# Patient Record
Sex: Female | Born: 1985 | Race: White | Hispanic: No | Smoking: Never smoker
Health system: Southern US, Community
[De-identification: ages and names within clinical notes are randomized; demographics above are authoritative.]

## PROBLEM LIST (undated history)

## (undated) ENCOUNTER — Inpatient Hospital Stay (HOSPITAL_COMMUNITY): Payer: Self-pay

## (undated) DIAGNOSIS — N83209 Unspecified ovarian cyst, unspecified side: Secondary | ICD-10-CM

## (undated) DIAGNOSIS — R102 Pelvic and perineal pain: Secondary | ICD-10-CM

## (undated) DIAGNOSIS — G8929 Other chronic pain: Secondary | ICD-10-CM

## (undated) DIAGNOSIS — I4510 Unspecified right bundle-branch block: Secondary | ICD-10-CM

## (undated) DIAGNOSIS — Z8619 Personal history of other infectious and parasitic diseases: Secondary | ICD-10-CM

## (undated) DIAGNOSIS — R1031 Right lower quadrant pain: Secondary | ICD-10-CM

## (undated) DIAGNOSIS — N946 Dysmenorrhea, unspecified: Secondary | ICD-10-CM

## (undated) HISTORY — PX: UPPER GASTROINTESTINAL ENDOSCOPY: SHX188

## (undated) HISTORY — PX: LAPAROSCOPIC ENDOMETRIOSIS FULGURATION: SUR769

## (undated) HISTORY — PX: WISDOM TOOTH EXTRACTION: SHX21

## (undated) HISTORY — DX: Unspecified ovarian cyst, unspecified side: N83.209

## (undated) HISTORY — DX: Personal history of other infectious and parasitic diseases: Z86.19

---

## 2007-08-13 ENCOUNTER — Emergency Department (HOSPITAL_COMMUNITY): Admission: EM | Admit: 2007-08-13 | Discharge: 2007-08-13 | Payer: Self-pay | Admitting: Emergency Medicine

## 2008-11-30 HISTORY — PX: TONSILLECTOMY: SUR1361

## 2011-10-14 ENCOUNTER — Emergency Department (HOSPITAL_COMMUNITY)
Admission: EM | Admit: 2011-10-14 | Discharge: 2011-10-15 | Disposition: A | Discharge status: Home / Self Care | Payer: BC Managed Care – PPO | Attending: Emergency Medicine | Admitting: Emergency Medicine

## 2011-10-14 ENCOUNTER — Encounter: Payer: Self-pay | Admitting: Emergency Medicine

## 2011-10-14 DIAGNOSIS — N949 Unspecified condition associated with female genital organs and menstrual cycle: Secondary | ICD-10-CM | POA: Insufficient documentation

## 2011-10-14 DIAGNOSIS — N644 Mastodynia: Secondary | ICD-10-CM | POA: Insufficient documentation

## 2011-10-14 DIAGNOSIS — R109 Unspecified abdominal pain: Secondary | ICD-10-CM | POA: Insufficient documentation

## 2011-10-14 DIAGNOSIS — M79609 Pain in unspecified limb: Secondary | ICD-10-CM | POA: Insufficient documentation

## 2011-10-14 DIAGNOSIS — R1110 Vomiting, unspecified: Secondary | ICD-10-CM | POA: Insufficient documentation

## 2011-10-14 DIAGNOSIS — N83209 Unspecified ovarian cyst, unspecified side: Secondary | ICD-10-CM

## 2011-10-14 LAB — DIFFERENTIAL
Eosinophils Relative: 0 % (ref 0–5)
Lymphocytes Relative: 12 % (ref 12–46)
Lymphs Abs: 1.7 10*3/uL (ref 0.7–4.0)
Monocytes Absolute: 1.2 10*3/uL — ABNORMAL HIGH (ref 0.1–1.0)
Monocytes Relative: 9 % (ref 3–12)

## 2011-10-14 LAB — BASIC METABOLIC PANEL
CO2: 22 mEq/L (ref 19–32)
Calcium: 9.5 mg/dL (ref 8.4–10.5)
Creatinine, Ser: 0.67 mg/dL (ref 0.50–1.10)
Glucose, Bld: 95 mg/dL (ref 70–99)
Sodium: 135 mEq/L (ref 135–145)

## 2011-10-14 LAB — CBC
HCT: 38.2 % (ref 36.0–46.0)
Hemoglobin: 13.4 g/dL (ref 12.0–15.0)
MCV: 91 fL (ref 78.0–100.0)
RBC: 4.2 MIL/uL (ref 3.87–5.11)
WBC: 13.7 10*3/uL — ABNORMAL HIGH (ref 4.0–10.5)

## 2011-10-14 LAB — URINALYSIS, ROUTINE W REFLEX MICROSCOPIC
Glucose, UA: NEGATIVE mg/dL
Hgb urine dipstick: NEGATIVE
Protein, ur: NEGATIVE mg/dL

## 2011-10-14 LAB — POCT PREGNANCY, URINE: Preg Test, Ur: NEGATIVE

## 2011-10-14 MED ORDER — FENTANYL CITRATE 0.05 MG/ML IJ SOLN
50.0000 ug | INTRAMUSCULAR | Status: DC | PRN
  Administered 2011-10-14 – 2011-10-15 (×2): 50 ug via INTRAVENOUS
  Filled 2011-10-14 (×2): qty 2

## 2011-10-14 MED ORDER — ONDANSETRON HCL 4 MG/2ML IJ SOLN
4.0000 mg | Freq: Once | INTRAMUSCULAR | Status: AC
  Administered 2011-10-14: 4 mg via INTRAVENOUS
  Filled 2011-10-14: qty 2

## 2011-10-14 MED ORDER — SODIUM CHLORIDE 0.9 % IV SOLN
Freq: Once | INTRAVENOUS | Status: AC
  Administered 2011-10-14: 23:00:00 via INTRAVENOUS

## 2011-10-14 NOTE — ED Provider Notes (Signed)
History     CSN: [id number] Arrival date & time: 10/14/2011  8:51 PM   First MD Initiated Contact with Patient 10/14/11 2248      Chief Complaint  Patient presents with  . Abdominal Pain    (Consider location/radiation/quality/duration/timing/severity/associated sxs/prior treatment) HPI This is a 25 year old white female with a history of endometriosis. She's had a four-day history of left lower and left upper abdominal pain. The pain is described as deep and worse with palpation or movement. It radiates to the left flank and the back of the left leg. She also complains of pain in the left breast for about a month but states this is a different pain in May not be related. She has had some vomiting which he describes as retching. She denies diarrhea, dysuria, fever, vaginal bleeding or discharge. She has had chills. Her pain is described as moderate to severe. It is different than prior her endometriosis pain. It has not been controlled with her home medications, and has been worsening.  Past Medical History  Diagnosis Date  . Endometriosis     Past Surgical History  Procedure Date  . Laparoscopic endometriosis fulguration   . Tonsillectomy   . Upper gastrointestinal endoscopy     No family history on file.  History  Substance Use Topics  . Smoking status: Never Smoker   . Smokeless tobacco: Not on file  . Alcohol Use: Yes     OCCASIONAL    OB History    Grav Para Term Preterm Abortions TAB SAB Ect Mult Living                  Review of Systems  All other systems reviewed and are negative.    Allergies  Review of patient's allergies indicates no known allergies.  Home Medications   Current Outpatient Rx  Name Route Sig Dispense Refill  . HYDROCODONE-ACETAMINOPHEN 5-500 MG PO TABS Oral Take 1-2 tablets by mouth every 6 (six) hours as needed. For pain       BP 109/66  Pulse 96  Temp(Src) 99.1 F (37.3 C) (Oral)  Resp 19  SpO2 97%  LMP  09/13/2011  Physical Exam General: Well-developed, well-nourished female in no acute distress; appearance consistent with age of record HENT: normocephalic, atraumatic Eyes: pupils equal round and reactive to light; extraocular muscles intact Neck: supple Heart: regular rate and rhythm; no murmurs, rubs or gallops Lungs: clear to auscultation bilaterally Chest: Left submammary tenderness Abdomen: soft; moderate to severe left lower left upper quadrant tenderness; nondistended; no masses or hepatosplenomegaly; bowel sounds present GU: Left flank tenderness; normal external genitalia; bilateral adnexal tenderness L>R which does not reproduce the pain of chief complaint Extremities: No deformity; full range of motion; pulses normal Neurologic: Awake, alert and oriented; motor function intact in all extremities and symmetric; no facial droop Skin: Warm and dry Psychiatric: Normal mood and affect    ED Course  Procedures (including critical care time)    MDM   Nursing notes and vitals signs, including pulse oximetry, reviewed.  Summary of this visit's results, reviewed by myself:  Labs:  Results for orders placed during the hospital encounter of 10/14/11  CBC      Component Value Range   WBC 13.7 (*) 4.0 - 10.5 (K/uL)   RBC 4.20  3.87 - 5.11 (MIL/uL)   Hemoglobin 13.4  12.0 - 15.0 (g/dL)   HCT 82.9  56.2 - 13.0 (%)   MCV 91.0  78.0 - 100.0 (fL)  MCH 31.9  26.0 - 34.0 (pg)   MCHC 35.1  30.0 - 36.0 (g/dL)   RDW 16.1  09.6 - 04.5 (%)   Platelets 263  150 - 400 (K/uL)  DIFFERENTIAL      Component Value Range   Neutrophils Relative 79 (*) 43 - 77 (%)   Neutro Abs 10.8 (*) 1.7 - 7.7 (K/uL)   Lymphocytes Relative 12  12 - 46 (%)   Lymphs Abs 1.7  0.7 - 4.0 (K/uL)   Monocytes Relative 9  3 - 12 (%)   Monocytes Absolute 1.2 (*) 0.1 - 1.0 (K/uL)   Eosinophils Relative 0  0 - 5 (%)   Eosinophils Absolute 0.1  0.0 - 0.7 (K/uL)   Basophils Relative 0  0 - 1 (%)   Basophils  Absolute 0.0  0.0 - 0.1 (K/uL)  BASIC METABOLIC PANEL      Component Value Range   Sodium 135  135 - 145 (mEq/L)   Potassium 3.5  3.5 - 5.1 (mEq/L)   Chloride 100  96 - 112 (mEq/L)   CO2 22  19 - 32 (mEq/L)   Glucose, Bld 95  70 - 99 (mg/dL)   BUN 12  6 - 23 (mg/dL)   Creatinine, Ser 4.09  0.50 - 1.10 (mg/dL)   Calcium 9.5  8.4 - 81.1 (mg/dL)   GFR calc non Af Amer >90  >90 (mL/min)   GFR calc Af Amer >90  >90 (mL/min)  URINALYSIS, ROUTINE W REFLEX MICROSCOPIC      Component Value Range   Color, Urine YELLOW  YELLOW    Appearance CLEAR  CLEAR    Specific Gravity, Urine 1.015  1.005 - 1.030    pH 5.5  5.0 - 8.0    Glucose, UA NEGATIVE  NEGATIVE (mg/dL)   Hgb urine dipstick NEGATIVE  NEGATIVE    Bilirubin Urine NEGATIVE  NEGATIVE    Ketones, ur NEGATIVE  NEGATIVE (mg/dL)   Protein, ur NEGATIVE  NEGATIVE (mg/dL)   Urobilinogen, UA 0.2  0.0 - 1.0 (mg/dL)   Nitrite NEGATIVE  NEGATIVE    Leukocytes, UA NEGATIVE  NEGATIVE   POCT PREGNANCY, URINE      Component Value Range   Preg Test, Ur NEGATIVE    LIPASE, BLOOD      Component Value Range   Lipase 19  11 - 59 (U/L)   Ct Abdomen Pelvis W Contrast  10/15/2011  *RADIOLOGY REPORT*  Clinical Data: Left-sided abdominal pain for 5 days; vomiting. Slight chills.  Dysuria and abdominal cramping.  CT ABDOMEN AND PELVIS WITH CONTRAST  Technique:  Multidetector CT imaging of the abdomen and pelvis was performed following the standard protocol during bolus administration of intravenous contrast.  Contrast: OMNIPAQUE IOHEXOL 300 MG/ML IV SOLN  Comparison: None.  Findings: The visualized lung bases are clear.  A small 8 mm hypodensity is noted within the spleen; this is nonspecific but may reflect a small cyst.  The liver is unremarkable in appearance.  The gallbladder is within normal limits.  The pancreas and adrenal glands are unremarkable.  The kidneys are unremarkable in appearance.  There is no evidence of hydronephrosis.  No renal or  ureteral stones are seen.  No perinephric stranding is appreciated.  No free fluid is identified.  The small bowel is unremarkable in appearance.  The stomach is within normal limits.  No acute vascular abnormalities are seen.  The appendix is normal in caliber and contains trace contrast, without evidence for appendicitis.  Contrast progresses to the level of the distal transverse colon.  The colon is unremarkable in appearance.  The bladder is mildly distended and grossly unremarkable in appearance.  The uterus is within normal limits. A 3.1 cm left adnexal cystic lesion is noted; though this is likely physiologic in nature, pelvic ultrasound could be considered for further evaluation if the patient has left adnexal symptoms.  Trace free fluid within the pelvis is likely physiologic.  No inguinal lymphadenopathy is seen.  No acute osseous abnormalities are identified.  IMPRESSION:  1.  3.1 cm left adnexal cystic lesion; though this is likely physiologic in nature, pelvic ultrasound could be considered for further evaluation if the patient has left adnexal symptoms.  Trace free fluid within the pelvis is likely physiologic. 2.  8 mm hypodensity within the spleen is nonspecific but may reflect a small cyst.  Original Report Authenticated By: Tonia Ghent, M.D.   4:00 AM Patient requests referral to gynecologist. Suspect pain is a combination of left ovarian cyst and endometriosis.       Carlisle Beers Narely Nobles, MD 10/15/11 0400

## 2011-10-14 NOTE — ED Notes (Signed)
PT. REPORTS LEFT ABDOMINAL PAIN X5 DAYS , VOMITTING , DENIES DIARRHEA ,  SLIGHT CHILLS,  DENIES VAGINAL DISCHARGE .  DYSURIA WITH LOW ABDOMINAL CRAMPING.

## 2011-10-15 ENCOUNTER — Emergency Department (HOSPITAL_COMMUNITY): Payer: BC Managed Care – PPO

## 2011-10-15 ENCOUNTER — Encounter (HOSPITAL_COMMUNITY): Payer: Self-pay | Admitting: Radiology

## 2011-10-15 MED ORDER — FENTANYL CITRATE 0.05 MG/ML IJ SOLN
100.0000 ug | Freq: Once | INTRAMUSCULAR | Status: AC
  Administered 2011-10-15: 100 ug via INTRAVENOUS
  Filled 2011-10-15: qty 2

## 2011-10-15 MED ORDER — IOHEXOL 300 MG/ML  SOLN
100.0000 mL | Freq: Once | INTRAMUSCULAR | Status: AC | PRN
  Administered 2011-10-15: 100 mL via INTRAVENOUS

## 2011-10-15 MED ORDER — OXYCODONE-ACETAMINOPHEN 5-325 MG PO TABS: 1.0000 | ORAL_TABLET | ORAL | Status: AC | PRN

## 2011-10-15 MED ORDER — ONDANSETRON HCL 8 MG PO TABS: 8.0000 mg | ORAL_TABLET | Freq: Three times a day (TID) | ORAL | Status: AC | PRN

## 2011-10-15 NOTE — ED Notes (Signed)
Pt given oral contrast.

## 2011-10-15 NOTE — ED Notes (Signed)
Patient states her pain is coming and going.

## 2011-10-15 NOTE — ED Notes (Signed)
Pt ambulatory to restroom steady gait noted. No distress noted. Pt awaiting CT

## 2011-12-01 HISTORY — PX: COLONOSCOPY: SHX174

## 2011-12-01 NOTE — L&D Delivery Note (Signed)
Delivery Note At 2:12 PM a viable and healthy female was delivered via Vaginal, Spontaneous Delivery (Presentation: ; Occiput Anterior).  APGAR: 8, 9; weight .   Placenta status: Intact, Spontaneous.  Cord: 3 vessels with the following complications: None.  Cord pH: na. Loose  x one.  Anesthesia: Epidural  Episiotomy: None Lacerations: 2nd degree;Perineal Suture Repair: 2.0 3.0 vicryl rapide Est. Blood Loss (mL): 300  Mom to postpartum.  Baby to nursery-stable.  Sherry Carr J 08/04/2012, 2:27 PM

## 2011-12-06 ENCOUNTER — Encounter (HOSPITAL_COMMUNITY): Payer: Self-pay | Admitting: Pharmacist

## 2011-12-09 ENCOUNTER — Other Ambulatory Visit: Payer: Self-pay | Admitting: Obstetrics and Gynecology

## 2011-12-16 ENCOUNTER — Other Ambulatory Visit (HOSPITAL_COMMUNITY): Payer: BC Managed Care – PPO

## 2011-12-17 ENCOUNTER — Other Ambulatory Visit (HOSPITAL_COMMUNITY): Payer: BC Managed Care – PPO

## 2011-12-18 ENCOUNTER — Ambulatory Visit (HOSPITAL_COMMUNITY)
Admission: RE | Admit: 2011-12-18 | Payer: BC Managed Care – PPO | Source: Ambulatory Visit | Admitting: Obstetrics and Gynecology

## 2011-12-18 ENCOUNTER — Encounter (HOSPITAL_COMMUNITY): Admission: RE | Payer: Self-pay | Source: Ambulatory Visit

## 2011-12-18 SURGERY — ROBOTIC ASSISTED LAPAROSCOPIC LYSIS OF ADHESION
Anesthesia: General

## 2012-01-20 LAB — OB RESULTS CONSOLE GC/CHLAMYDIA: Chlamydia: NEGATIVE

## 2012-02-10 LAB — OB RESULTS CONSOLE ANTIBODY SCREEN: Antibody Screen: NEGATIVE

## 2012-02-10 LAB — OB RESULTS CONSOLE RUBELLA ANTIBODY, IGM: Rubella: IMMUNE

## 2012-05-08 ENCOUNTER — Encounter (HOSPITAL_COMMUNITY): Payer: Self-pay | Admitting: Emergency Medicine

## 2012-05-08 ENCOUNTER — Inpatient Hospital Stay (HOSPITAL_COMMUNITY)
Admission: EM | Admit: 2012-05-08 | Discharge: 2012-05-08 | Disposition: A | Discharge status: Home / Self Care | Payer: BC Managed Care – PPO | Attending: Obstetrics and Gynecology | Admitting: Obstetrics and Gynecology

## 2012-05-08 DIAGNOSIS — W19XXXA Unspecified fall, initial encounter: Secondary | ICD-10-CM

## 2012-05-08 DIAGNOSIS — O26899 Other specified pregnancy related conditions, unspecified trimester: Secondary | ICD-10-CM

## 2012-05-08 DIAGNOSIS — Y92009 Unspecified place in unspecified non-institutional (private) residence as the place of occurrence of the external cause: Secondary | ICD-10-CM | POA: Insufficient documentation

## 2012-05-08 DIAGNOSIS — O99891 Other specified diseases and conditions complicating pregnancy: Secondary | ICD-10-CM | POA: Insufficient documentation

## 2012-05-08 DIAGNOSIS — R1032 Left lower quadrant pain: Secondary | ICD-10-CM | POA: Insufficient documentation

## 2012-05-08 DIAGNOSIS — W1809XA Striking against other object with subsequent fall, initial encounter: Secondary | ICD-10-CM | POA: Insufficient documentation

## 2012-05-08 LAB — RAPID URINE DRUG SCREEN, HOSP PERFORMED
Amphetamines: NOT DETECTED
Barbiturates: NOT DETECTED
Benzodiazepines: NOT DETECTED
Tetrahydrocannabinol: NOT DETECTED

## 2012-05-08 LAB — OB RESULTS CONSOLE GC/CHLAMYDIA: Chlamydia: NEGATIVE

## 2012-05-08 LAB — OB RESULTS CONSOLE RPR: RPR: NONREACTIVE

## 2012-05-08 LAB — OB RESULTS CONSOLE ABO/RH: RH Type: POSITIVE

## 2012-05-08 MED ORDER — KETOROLAC TROMETHAMINE 30 MG/ML IJ SOLN
30.0000 mg | Freq: Once | INTRAMUSCULAR | Status: AC
  Administered 2012-05-08: 30 mg via INTRAMUSCULAR
  Filled 2012-05-08: qty 1

## 2012-05-08 NOTE — Progress Notes (Signed)
Report called to Junious Dresser, RN in MAU to accept pt for further monitoring. Thayer Ohm, ED RN notified.  Care link to be called for transfer

## 2012-05-08 NOTE — MAU Note (Signed)
1.5 hours of monitoring completed at [facility] ED. Fetal monitoring strip placed with chart.
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
## 2012-05-08 NOTE — MAU Note (Signed)
Pt has a ride home with a friend. Patient says she feels safe riding with her friend and going home.
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
## 2012-05-08 NOTE — Progress Notes (Signed)
Care link here to transfer pt to MAU at [facility]
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
## 2012-07-21 ENCOUNTER — Telehealth (HOSPITAL_COMMUNITY): Payer: Self-pay | Admitting: *Deleted

## 2012-07-21 ENCOUNTER — Encounter (HOSPITAL_COMMUNITY): Payer: Self-pay | Admitting: *Deleted

## 2012-07-21 NOTE — Telephone Encounter (Signed)
Preadmission screen  

## 2012-08-01 ENCOUNTER — Inpatient Hospital Stay (HOSPITAL_COMMUNITY): Payer: BC Managed Care – PPO

## 2012-08-01 ENCOUNTER — Inpatient Hospital Stay (HOSPITAL_COMMUNITY)
Admission: AD | Admit: 2012-08-01 | Discharge: 2012-08-01 | Disposition: A | Discharge status: Home / Self Care | Payer: BC Managed Care – PPO | Source: Ambulatory Visit | Attending: Obstetrics & Gynecology | Admitting: Obstetrics & Gynecology

## 2012-08-01 ENCOUNTER — Encounter (HOSPITAL_COMMUNITY): Payer: Self-pay | Admitting: *Deleted

## 2012-08-01 DIAGNOSIS — O368190 Decreased fetal movements, unspecified trimester, not applicable or unspecified: Secondary | ICD-10-CM | POA: Insufficient documentation

## 2012-08-01 LAB — URINE MICROSCOPIC-ADD ON

## 2012-08-01 LAB — URINALYSIS, ROUTINE W REFLEX MICROSCOPIC
Bilirubin Urine: NEGATIVE
Hgb urine dipstick: NEGATIVE
Protein, ur: NEGATIVE mg/dL
Urobilinogen, UA: 0.2 mg/dL (ref 0.0–1.0)

## 2012-08-01 LAB — AMNISURE RUPTURE OF MEMBRANE (ROM) NOT AT ARMC: Amnisure ROM: NEGATIVE

## 2012-08-01 NOTE — MAU Provider Note (Signed)
  History     CSN: [id number]  Arrival date and time: 08/01/12 1443   First Provider Initiated Contact with Patient 08/01/12 1630      Chief Complaint  Patient presents with  . Decreased Fetal Movement  . Vaginal Discharge   HPI 26 yo, G1 at 38.4/7 wks. C/o Decreased FMs.  Patent c/o not feeling any fetal movement since last night. She tried drinking water, resting on left side, eating, drinking fluids without any active FMs. Denies contrxns. C/o wet vaginal discharge but no active leaking. Denies bleeding.  Denies pregnancy complications, good PNCare (Dr Stana Bunting)  Past Medical History  Diagnosis Date  . Endometriosis   . Pregnant   . Ovarian cyst   . H/O varicella   . Contact with or exposure to other viral diseases     Past Surgical History  Procedure Date  . Laparoscopic endometriosis fulguration   . Tonsillectomy   . Upper gastrointestinal endoscopy   . Colonoscopy   . Wisdom tooth extraction     Family History  Problem Relation Age of Onset  . Adopted: Yes  . Anesthesia problems Neg Hx     History  Substance Use Topics  . Smoking status: Never Smoker   . Smokeless tobacco: Never Used  . Alcohol Use: Yes     OCCASIONAL    Allergies: No Known Allergies  Prescriptions prior to admission  Medication Sig Dispense Refill  . fluticasone (FLONASE) 50 MCG/ACT nasal spray Place 2 sprays into the nose daily as needed. For allergies      . pantoprazole (PROTONIX) 40 MG tablet Take 40 mg by mouth daily.       . Prenatal Vit-Fe Fumarate-FA (PRENATAL MULTIVITAMIN) TABS Take 1 tablet by mouth daily.        ROS Physical Exam   Blood pressure 121/70, pulse 81, temperature 98.1 F (36.7 C), temperature source Oral, resp. rate 16, height 5' 3.5" (1.613 m), weight 167 lb 3.2 oz (75.841 kg), last menstrual period 10/27/2011, SpO2 99.00%.  Physical Exam A&O x 3, no acute distress. Pleasant Abdo soft, non tender, non acute Extr no edema/ tenderness Pelvic  -no fluid. Amnisure - negative  FHT - 120/ + accels/ no decels/ moderate variability- category I Toco noted irreg contrxns (pt feels has mild cramps)  Patient still does not feels active FMs here, will get sono for BPP and AFI.   MAU Course  Procedures Limited Sono - AFI 14 cm, BPP 8/8.  Per sono report- fetal decel to 118 from 135 noted, could be return to baseline after acceleration to 135 since there are NO decelerations noted on FHT before and after sonography and FHT baseline is in 117-120 range.   Assessment and Plan  38.4 wks, Decreased FMs with NST in category I and BPP 8/8 annd nl AFI.  Kick counts reviewed, will see her back in office as scheduled tomorrow (Dr Billy Coast) and will add NST to office visit. FAC reviewed, labor precautions. Reassurance.   Mikah Rottinghaus R 08/01/2012, 5:12 PM

## 2012-08-01 NOTE — Progress Notes (Signed)
Dr. Juliene Pina informed of U/S results, & that decels were seen on U/S.  No decels present on EFM since pt in MAU.  Tracing reviewed by Dr. Juliene Pina.  DC order received.

## 2012-08-01 NOTE — MAU Note (Signed)
Patient states she has not felt any fetal movement since last night. Fetal heart rate in triage in the 150's. States she had a gush of clear fluid last night at 2100 and again this am. Continues to feel wet. No contractions.

## 2012-08-03 ENCOUNTER — Inpatient Hospital Stay (HOSPITAL_COMMUNITY)
Admission: AD | Admit: 2012-08-03 | Discharge: 2012-08-06 | DRG: 373 | Disposition: A | Discharge status: Home / Self Care | Payer: BC Managed Care – PPO | Source: Ambulatory Visit | Attending: Obstetrics and Gynecology | Admitting: Obstetrics and Gynecology

## 2012-08-03 ENCOUNTER — Encounter (HOSPITAL_COMMUNITY): Payer: Self-pay | Admitting: *Deleted

## 2012-08-03 DIAGNOSIS — O368190 Decreased fetal movements, unspecified trimester, not applicable or unspecified: Secondary | ICD-10-CM | POA: Diagnosis present

## 2012-08-03 LAB — OB RESULTS CONSOLE HIV ANTIBODY (ROUTINE TESTING): HIV: NONREACTIVE

## 2012-08-03 LAB — CBC
HCT: 32 % — ABNORMAL LOW (ref 36.0–46.0)
Hemoglobin: 10.6 g/dL — ABNORMAL LOW (ref 12.0–15.0)
MCHC: 33.1 g/dL (ref 30.0–36.0)
MCV: 91.7 fL (ref 78.0–100.0)

## 2012-08-03 LAB — OB RESULTS CONSOLE RPR: RPR: NONREACTIVE

## 2012-08-03 MED ORDER — OXYTOCIN BOLUS FROM INFUSION
250.0000 mL | Freq: Once | INTRAVENOUS | Status: DC
  Filled 2012-08-03: qty 500

## 2012-08-03 MED ORDER — SODIUM CHLORIDE 0.9 % IJ SOLN: 3.0000 mL | Freq: Two times a day (BID) | INTRAMUSCULAR | Status: DC

## 2012-08-03 MED ORDER — CITRIC ACID-SODIUM CITRATE 334-500 MG/5ML PO SOLN: 30.0000 mL | ORAL | Status: DC | PRN

## 2012-08-03 MED ORDER — LACTATED RINGERS IV SOLN
INTRAVENOUS | Status: DC
  Administered 2012-08-03 – 2012-08-04 (×4): via INTRAVENOUS

## 2012-08-03 MED ORDER — ACETAMINOPHEN 325 MG PO TABS: 650.0000 mg | ORAL_TABLET | ORAL | Status: DC | PRN

## 2012-08-03 MED ORDER — MISOPROSTOL 25 MCG QUARTER TABLET
25.0000 ug | ORAL_TABLET | ORAL | Status: DC
  Administered 2012-08-03 (×2): 25 ug via VAGINAL
  Filled 2012-08-03 (×3): qty 1
  Filled 2012-08-03: qty 0.25
  Filled 2012-08-03 (×2): qty 1
  Filled 2012-08-03: qty 0.25
  Filled 2012-08-03: qty 1

## 2012-08-03 MED ORDER — LIDOCAINE HCL (PF) 1 % IJ SOLN
30.0000 mL | INTRAMUSCULAR | Status: DC | PRN
  Filled 2012-08-03: qty 30

## 2012-08-03 MED ORDER — PENICILLIN G POTASSIUM 5000000 UNITS IJ SOLR: 5.0000 10*6.[IU] | Freq: Once | INTRAVENOUS | Status: DC

## 2012-08-03 MED ORDER — ONDANSETRON HCL 4 MG/2ML IJ SOLN
4.0000 mg | Freq: Four times a day (QID) | INTRAMUSCULAR | Status: DC | PRN
  Administered 2012-08-04: 4 mg via INTRAVENOUS
  Filled 2012-08-03 (×2): qty 2

## 2012-08-03 MED ORDER — SODIUM CHLORIDE 0.9 % IV SOLN: 250.0000 mL | INTRAVENOUS | Status: DC | PRN

## 2012-08-03 MED ORDER — OXYCODONE-ACETAMINOPHEN 5-325 MG PO TABS: 1.0000 | ORAL_TABLET | ORAL | Status: DC | PRN

## 2012-08-03 MED ORDER — IBUPROFEN 600 MG PO TABS: 600.0000 mg | ORAL_TABLET | Freq: Four times a day (QID) | ORAL | Status: DC | PRN

## 2012-08-03 MED ORDER — OXYTOCIN 40 UNITS IN LACTATED RINGERS INFUSION - SIMPLE MED: 62.5000 mL/h | Freq: Once | INTRAVENOUS | Status: DC

## 2012-08-03 MED ORDER — FLEET ENEMA 7-19 GM/118ML RE ENEM: 1.0000 | ENEMA | RECTAL | Status: DC | PRN

## 2012-08-03 MED ORDER — PENICILLIN G POTASSIUM 5000000 UNITS IJ SOLR: 2.5000 10*6.[IU] | INTRAVENOUS | Status: DC

## 2012-08-03 MED ORDER — SODIUM CHLORIDE 0.9 % IJ SOLN: 3.0000 mL | INTRAMUSCULAR | Status: DC | PRN

## 2012-08-03 MED ORDER — LACTATED RINGERS IV SOLN: 500.0000 mL | INTRAVENOUS | Status: DC | PRN

## 2012-08-03 MED ORDER — ZOLPIDEM TARTRATE 5 MG PO TABS
5.0000 mg | ORAL_TABLET | Freq: Every evening | ORAL | Status: DC | PRN
  Administered 2012-08-03: 5 mg via ORAL
  Filled 2012-08-03: qty 1

## 2012-08-03 MED ORDER — TERBUTALINE SULFATE 1 MG/ML IJ SOLN: 0.2500 mg | Freq: Once | INTRAMUSCULAR | Status: AC | PRN

## 2012-08-03 NOTE — Progress Notes (Signed)
Sherry Carr is a 26 y.o. G2P0010 at [redacted]w[redacted]d by LMP admitted for induction of labor due to history of decreased FM, Variable decelerations on repeat NSTs. History of chronic LOF with nl AFI on sono and neg Fern and nitrazine.  Subjective: No complaints.  Objective: BP 114/66  Pulse 88  Temp 98.1 F (36.7 C) (Oral)  Resp 16  Ht 5' 3.5" (1.613 m)  Wt 76.204 kg (168 lb)  BMI 29.29 kg/m2  LMP 10/27/2011      FHT:  FHR: 155 bpm, variability: moderate,  accelerations:  Present,  decelerations:  Absent UC:   rare SVE:   Dilation: Closed Effacement (%): Thick Station: -3 Exam by:: Bonnielee Haff RN Cytotec placed  Labs: Lab Results  Component Value Date   WBC 10.0 08/03/2012   HGB 10.6* 08/03/2012   HCT 32.0* 08/03/2012   MCV 91.7 08/03/2012   PLT 284 08/03/2012    Assessment / Plan: 39 weeks Induction for history of dec FM and decels on NST  Labor: Progressing normally Preeclampsia:  na Fetal Wellbeing:  Category I Pain Control:  Labor support without medications I/D:  n/a Anticipated MOD:  NSVD  Sherry Carr 08/03/2012, 7:44 PM

## 2012-08-03 NOTE — H&P (Signed)
NAMETERRAH, DECOSTER NO.:  [id number]  MEDICAL RECORD NO.:  [id number]  LOCATION:  9175                          FACILITY:  [facility]  PHYSICIAN:  Lenoard Aden, M.D.DATE OF BIRTH:  December 10, 1985  DATE OF ADMISSION:  08/03/2012 DATE OF DISCHARGE:                             HISTORY & PHYSICAL   CHIEF COMPLAINT:  History of decelerations, decreased fetal movement, and  questionable leakage of fluid for induction at 39 weeks.  HISTORY OF PRESENT ILLNESS:  She is a 26 year old white female, G2, P0 at [redacted] weeks gestation who presents for induction.  The patient has a history of repetitive visits to the hospital for decreased fetal movement.  An NST is performed at the hospital and in the office the patient has had a history of intermittent deep variable decelerations, but otherwise reactive NSTs.  She has had multiple ultrasounds revealing normal fluid and adequate growth.  She continues to report decreased fetal movement.  Due to the concerns and issues of intermittent variable decelerations, decision was made to proceed with cervical ripening and induction at 39 weeks' gestation.  ALLERGIES:  No known drug allergies.  MEDICATIONS:  Prenatal vitamins and Flonase as needed.  MEDICAL HISTORY:  She has a medical history of endometriosis and gastroesophageal reflux.  SOCIAL HISTORY:  She has a social history, which is noncontributory.  FAMILY HISTORY:  Noncontributory.  Previous history of an abortion at 6 weeks in 2009, prenatal course as noted.  SURGICAL HISTORY:  For tonsillectomy and laparoscopy for endometriosis.  PHYSICAL EXAMINATION:  GENERAL:  She is a well-developed well-nourished white female, in no acute distress. HEENT:  Normal. NECK:  Supple.  Full range of motion. LUNGS:  Clear. HEART:  Regular rhythm. ABDOMEN:  Soft, gravid, nontender.  Estimated fetal weight 7 to 7.5 pounds. GU:  Cervix is closed, 50%, vertex, -2. EXTREMITIES:  There are no  cords. NEUROLOGIC:  Nonfocal. SKIN:  Intact.  Cytotec is placed.  NST is reactive.  Rare contractions were noted.  IMPRESSION: 1. A 39-week intrauterine pregnancy. 2. History of chronic decreased fetal movement, and history of     variable decelerations on NST with otherwise reassuring     surveillance.  PLAN:  Proceed with induction.  Cytotec placed, Pitocin in the a.m. Anticipate cautious attempts at vaginal delivery.     Lenoard Aden, M.D.     RJT/MEDQ  D:  08/03/2012  T:  08/03/2012  Job:  [id number]

## 2012-08-04 ENCOUNTER — Encounter (HOSPITAL_COMMUNITY): Payer: Self-pay | Admitting: Anesthesiology

## 2012-08-04 ENCOUNTER — Encounter (HOSPITAL_COMMUNITY): Payer: Self-pay

## 2012-08-04 ENCOUNTER — Inpatient Hospital Stay (HOSPITAL_COMMUNITY): Payer: BC Managed Care – PPO | Admitting: Anesthesiology

## 2012-08-04 MED ORDER — SIMETHICONE 80 MG PO CHEW
80.0000 mg | CHEWABLE_TABLET | ORAL | Status: DC | PRN
  Administered 2012-08-05 (×2): 80 mg via ORAL

## 2012-08-04 MED ORDER — FENTANYL 2.5 MCG/ML BUPIVACAINE 1/10 % EPIDURAL INFUSION (WH - ANES)
14.0000 mL/h | INTRAMUSCULAR | Status: DC
  Administered 2012-08-04 (×2): 14 mL/h via EPIDURAL
  Filled 2012-08-04 (×2): qty 60

## 2012-08-04 MED ORDER — PENICILLIN G POTASSIUM 5000000 UNITS IJ SOLR
5.0000 10*6.[IU] | Freq: Once | INTRAVENOUS | Status: AC
  Administered 2012-08-04: 5 10*6.[IU] via INTRAVENOUS
  Filled 2012-08-04: qty 5

## 2012-08-04 MED ORDER — METHYLERGONOVINE MALEATE 0.2 MG PO TABS: 0.2000 mg | ORAL_TABLET | ORAL | Status: DC | PRN

## 2012-08-04 MED ORDER — TERBUTALINE SULFATE 1 MG/ML IJ SOLN: 0.2500 mg | Freq: Once | INTRAMUSCULAR | Status: DC | PRN

## 2012-08-04 MED ORDER — WITCH HAZEL-GLYCERIN EX PADS: 1.0000 "application " | MEDICATED_PAD | CUTANEOUS | Status: DC | PRN

## 2012-08-04 MED ORDER — EPHEDRINE 5 MG/ML INJ: 10.0000 mg | INTRAVENOUS | Status: DC | PRN

## 2012-08-04 MED ORDER — ONDANSETRON HCL 4 MG PO TABS
4.0000 mg | ORAL_TABLET | ORAL | Status: DC | PRN
  Administered 2012-08-05: 4 mg via ORAL
  Filled 2012-08-04: qty 1

## 2012-08-04 MED ORDER — PRENATAL MULTIVITAMIN CH
1.0000 | ORAL_TABLET | Freq: Every day | ORAL | Status: DC
  Administered 2012-08-04 – 2012-08-06 (×3): 1 via ORAL
  Filled 2012-08-04 (×3): qty 1

## 2012-08-04 MED ORDER — PENICILLIN G POTASSIUM 5000000 UNITS IJ SOLR
2.5000 10*6.[IU] | INTRAVENOUS | Status: DC
  Administered 2012-08-04 (×2): 2.5 10*6.[IU] via INTRAVENOUS
  Filled 2012-08-04 (×5): qty 2.5

## 2012-08-04 MED ORDER — PHENYLEPHRINE 40 MCG/ML (10ML) SYRINGE FOR IV PUSH (FOR BLOOD PRESSURE SUPPORT)
80.0000 ug | PREFILLED_SYRINGE | INTRAVENOUS | Status: DC | PRN
  Filled 2012-08-04: qty 5

## 2012-08-04 MED ORDER — IBUPROFEN 600 MG PO TABS
600.0000 mg | ORAL_TABLET | Freq: Four times a day (QID) | ORAL | Status: DC
  Administered 2012-08-04 – 2012-08-06 (×8): 600 mg via ORAL
  Filled 2012-08-04 (×8): qty 1

## 2012-08-04 MED ORDER — SENNOSIDES-DOCUSATE SODIUM 8.6-50 MG PO TABS
2.0000 | ORAL_TABLET | Freq: Every day | ORAL | Status: DC
  Administered 2012-08-04 – 2012-08-05 (×2): 2 via ORAL

## 2012-08-04 MED ORDER — TETANUS-DIPHTH-ACELL PERTUSSIS 5-2.5-18.5 LF-MCG/0.5 IM SUSP
0.5000 mL | Freq: Once | INTRAMUSCULAR | Status: DC
  Filled 2012-08-04: qty 0.5

## 2012-08-04 MED ORDER — DIPHENHYDRAMINE HCL 25 MG PO CAPS: 25.0000 mg | ORAL_CAPSULE | Freq: Four times a day (QID) | ORAL | Status: DC | PRN

## 2012-08-04 MED ORDER — LIDOCAINE HCL (PF) 1 % IJ SOLN
INTRAMUSCULAR | Status: DC | PRN
  Administered 2012-08-04 (×2): 8 mL

## 2012-08-04 MED ORDER — FENTANYL 2.5 MCG/ML BUPIVACAINE 1/10 % EPIDURAL INFUSION (WH - ANES)
INTRAMUSCULAR | Status: DC | PRN
  Administered 2012-08-04: 14 mL/h via EPIDURAL

## 2012-08-04 MED ORDER — OXYTOCIN 40 UNITS IN LACTATED RINGERS INFUSION - SIMPLE MED: 1.0000 m[IU]/min | INTRAVENOUS | Status: DC

## 2012-08-04 MED ORDER — LANOLIN HYDROUS EX OINT: TOPICAL_OINTMENT | CUTANEOUS | Status: DC | PRN

## 2012-08-04 MED ORDER — OXYCODONE-ACETAMINOPHEN 5-325 MG PO TABS
1.0000 | ORAL_TABLET | ORAL | Status: DC | PRN
  Administered 2012-08-04 – 2012-08-05 (×2): 1 via ORAL
  Administered 2012-08-05 – 2012-08-06 (×4): 2 via ORAL
  Administered 2012-08-06: 1 via ORAL
  Filled 2012-08-04: qty 2
  Filled 2012-08-04: qty 1
  Filled 2012-08-04 (×2): qty 2
  Filled 2012-08-04: qty 1
  Filled 2012-08-04: qty 2
  Filled 2012-08-04: qty 1

## 2012-08-04 MED ORDER — ONDANSETRON HCL 4 MG/2ML IJ SOLN: 4.0000 mg | INTRAMUSCULAR | Status: DC | PRN

## 2012-08-04 MED ORDER — METHYLERGONOVINE MALEATE 0.2 MG/ML IJ SOLN: 0.2000 mg | INTRAMUSCULAR | Status: DC | PRN

## 2012-08-04 MED ORDER — OXYTOCIN 40 UNITS IN LACTATED RINGERS INFUSION - SIMPLE MED
1.0000 m[IU]/min | INTRAVENOUS | Status: DC
  Administered 2012-08-04: 2 m[IU]/min via INTRAVENOUS
  Filled 2012-08-04: qty 1000

## 2012-08-04 MED ORDER — DIPHENHYDRAMINE HCL 50 MG/ML IJ SOLN: 12.5000 mg | INTRAMUSCULAR | Status: DC | PRN

## 2012-08-04 MED ORDER — LACTATED RINGERS IV SOLN: 500.0000 mL | Freq: Once | INTRAVENOUS | Status: DC

## 2012-08-04 MED ORDER — DIBUCAINE 1 % RE OINT: 1.0000 "application " | TOPICAL_OINTMENT | RECTAL | Status: DC | PRN

## 2012-08-04 MED ORDER — BENZOCAINE-MENTHOL 20-0.5 % EX AERO
1.0000 "application " | INHALATION_SPRAY | CUTANEOUS | Status: DC | PRN
  Administered 2012-08-04: 1 via TOPICAL
  Filled 2012-08-04: qty 56

## 2012-08-04 MED ORDER — EPHEDRINE 5 MG/ML INJ
10.0000 mg | INTRAVENOUS | Status: DC | PRN
  Filled 2012-08-04: qty 4

## 2012-08-04 MED ORDER — BUTORPHANOL TARTRATE 1 MG/ML IJ SOLN
1.0000 mg | INTRAMUSCULAR | Status: DC | PRN
  Administered 2012-08-04 (×2): 1 mg via INTRAVENOUS
  Filled 2012-08-04 (×2): qty 1

## 2012-08-04 MED ORDER — PHENYLEPHRINE 40 MCG/ML (10ML) SYRINGE FOR IV PUSH (FOR BLOOD PRESSURE SUPPORT): 80.0000 ug | PREFILLED_SYRINGE | INTRAVENOUS | Status: DC | PRN

## 2012-08-04 MED ORDER — ZOLPIDEM TARTRATE 5 MG PO TABS: 5.0000 mg | ORAL_TABLET | Freq: Every evening | ORAL | Status: DC | PRN

## 2012-08-04 NOTE — Anesthesia Preprocedure Evaluation (Signed)

## 2012-08-04 NOTE — Progress Notes (Signed)
Sharleen Szczesny is a 26 y.o. G2P0010 at [redacted]w[redacted]d by LMP admitted for induction of labor due to indications as previously noted.  Subjective: Comfortable with epidural  Objective: BP 105/68  Pulse 89  Temp 98.5 F (36.9 C) (Axillary)  Resp 16  Ht 5' 3.5" (1.613 m)  Wt 76.204 kg (168 lb)  BMI 29.29 kg/m2  SpO2 100%  LMP 10/27/2011   Total I/O In: -  Out: 1100 [Urine:1100]  FHT:  FHR: 145 bpm, variability: moderate,  accelerations:  Present,  decelerations:  Absent UC:   regular, every 2-3 minutes(300MVU) SVE:   Dilation: 2.5 Effacement (%): 90 Station: -3;-2 Exam by:: Dr. Billy Coast [facility] previously placed Labs: Lab Results  Component Value Date   WBC 10.0 08/03/2012   HGB 10.6* 08/03/2012   HCT 32.0* 08/03/2012   MCV 91.7 08/03/2012   PLT 284 08/03/2012    Assessment / Plan: Induction of labor due to afore noted indications,  progressing well on pitocin  Labor: Progressing normally Preeclampsia:  na Fetal Wellbeing:  Category I Pain Control:  Epidural I/D:  n/a Anticipated MOD:  NSVD  Demba Nigh J 08/04/2012, 1:01 PM

## 2012-08-04 NOTE — Anesthesia Procedure Notes (Signed)
Epidural Patient location during procedure: OB Start time: 08/04/2012 7:59 AM End time: 08/04/2012 8:04 AM  Staffing Anesthesiologist: Sandrea Hughs Performed by: anesthesiologist   Preanesthetic Checklist Completed: patient identified, site marked, surgical consent, pre-op evaluation, timeout performed, IV checked, risks and benefits discussed and monitors and equipment checked  Epidural Patient position: sitting Prep: site prepped and draped and DuraPrep Patient monitoring: continuous pulse ox and blood pressure Approach: midline Injection technique: LOR air  Needle:  Needle type: Tuohy  Needle gauge: 17 G Needle length: 9 cm and 9 Needle insertion depth: 5 cm cm Catheter type: closed end flexible Catheter size: 19 Gauge Catheter at skin depth: 10 cm Test dose: negative and Other  Assessment Sensory level: T10 Events: blood not aspirated, injection not painful, no injection resistance, negative IV test and no paresthesia  Additional Notes Reason for block:procedure for pain

## 2012-08-05 ENCOUNTER — Encounter (HOSPITAL_COMMUNITY): Payer: Self-pay

## 2012-08-05 LAB — CBC
HCT: 30.2 % — ABNORMAL LOW (ref 36.0–46.0)
MCHC: 33.4 g/dL (ref 30.0–36.0)
Platelets: 228 10*3/uL (ref 150–400)
RDW: 13.7 % (ref 11.5–15.5)

## 2012-08-05 NOTE — Anesthesia Postprocedure Evaluation (Signed)
Anesthesia Post Note  Patient: Sherry Carr  Procedure(s) Performed: * No procedures listed *  Anesthesia type: Epidural  Patient location: Mother/Baby  Post pain: Pain level controlled  Post assessment: Post-op Vital signs reviewed  Last Vitals:  Filed Vitals:   08/05/12 0430  BP: 114/74  Pulse: 78  Temp: 36.7 C  Resp: 18    Post vital signs: Reviewed  Level of consciousness:alert  Complications: No apparent anesthesia complications

## 2012-08-05 NOTE — Progress Notes (Signed)
PPD 1 SVD  S:  Reports feeling bottom sore, + back pain and using warming pad             Tolerating po/ No nausea or vomiting             Bleeding is moderate             Pain controlled with Motrin and Percocet.             Up ad lib / ambulatory / voiding well.   Newborn  Information for the patient's newborn:  [name], [name] [[id number]]  female  breast feeding    O:  A & O x 3 NAD             VS:  Filed Vitals:   08/04/12 1610 08/04/12 1717 08/04/12 2100 08/05/12 0430  BP: 98/67 104/75 98/64 114/74  Pulse: 80 85 75 78  Temp: 97.8 F (36.6 C) 98.5 F (36.9 C) 98.1 F (36.7 C) 98 F (36.7 C)  TempSrc: Oral Oral Oral Oral  Resp: 16 18 18 18   Height:      Weight:      SpO2: 98% 98%  100%    LABS:  Basename 08/05/12 0530 08/03/12 1755  WBC 13.9* 10.0  HGB 10.1* 10.6*  HCT 30.2* 32.0*  PLT 228 284    Blood type: A/Positive/-- (06/09 0000)  Rubella: Immune (03/13 0000)   I&O: I/O last 3 completed shifts: In: -  Out: 2400 [Urine:2100; Blood:300]        Abdomen: soft, non-tender, non-distended              Fundus: firm, non-tender, U -1  Perineum: swollen labia, equal bilateral, mild tenderness to palpation, ice pack in place  Lochia: small  Extremities: no edema, no calf pain or tenderness, neg Homans    A/P: PPD # 1 25 y.o., [id number]    Active Problems:  NSVD (normal spontaneous vaginal delivery, 2nd deg lac, 9/5) Perineal edema, no evidence of hematoma,  hgb stable  Continue ice on perineum today, start sitz baths this eve.  Doing well - stable status  Routine post partum orders  Anticipate discharge home in AM.   Sherry Carr, CNM 08/05/2012, 9:00 AM

## 2012-08-06 MED ORDER — BREAST PUMP MISC: 1.0000 [IU] | Status: DC | PRN

## 2012-08-06 MED ORDER — OXYCODONE-ACETAMINOPHEN 5-325 MG PO TABS: 1.0000 | ORAL_TABLET | ORAL | Status: AC | PRN

## 2012-08-06 MED ORDER — WITCH HAZEL-GLYCERIN EX PADS: 1.0000 "application " | MEDICATED_PAD | CUTANEOUS | Status: AC | PRN

## 2012-08-06 MED ORDER — IBUPROFEN 600 MG PO TABS: 600.0000 mg | ORAL_TABLET | Freq: Four times a day (QID) | ORAL | Status: AC

## 2012-08-06 NOTE — Progress Notes (Signed)
Post Partum Day #2            Information for the patient's newborn:  Toshua, Honsinger [[id number]]  female   Feeding: breast  Subjective: No HA, SOB, CP, F/C, breast symptoms. Pain greatly improved, managed w/ Motrin and occasional Percocet. Normal vaginal bleeding, no clots.      Objective:  Temp:  [97.6 F (36.4 C)-97.8 F (36.6 C)] 97.6 F (36.4 C) (09/07 0557) Pulse Rate:  [57-75] 57  (09/07 0557) Resp:  [18] 18  (09/07 0557) BP: (100-104)/(59-66) 104/61 mmHg (09/07 0557)  No intake or output data in the 24 hours ending 08/06/12 1029     Basename 08/05/12 0530 08/03/12 1755  WBC 13.9* 10.0  HGB 10.1* 10.6*  HCT 30.2* 32.0*  PLT 228 284    Blood type: A/Positive/-- (06/09 0000) Rubella: Immune (03/13 0000)    Physical Exam:  General: alert, cooperative and no distress Uterine Fundus: firm Lochia: appropriate Perineum: repair intact, edema reduced, now minimal DVT Evaluation: Negative Homan's sign. No significant calf/ankle edema.    Assessment/Plan: PPD # 2 / 26 y.o., G2P1011 S/P:spontaneous vaginal   Active Problems:  NSVD (normal spontaneous vaginal delivery, 2nd deg lac, 9/5)    normal postpartum exam  Continue current postpartum care  D/C home   LOS: 3 days   Keil Pickering, CNM, [name] 08/06/2012, 10:29 AM

## 2012-08-06 NOTE — Discharge Summary (Signed)
Obstetric Discharge Summary Reason for Admission: induction of labor Prenatal Procedures: ultrasound Intrapartum Procedures: spontaneous vaginal delivery and GBS prophylaxis Postpartum Procedures: none Complications-Operative and Postpartum: 2nd degree perineal laceration Hemoglobin  Date Value Range Status  08/05/2012 10.1* 12.0 - 15.0 g/dL Final     HCT  Date Value Range Status  08/05/2012 30.2* 36.0 - 46.0 % Final    Physical Exam:  General: alert, cooperative and no distress Lochia: appropriate Uterine Fundus: firm Incision: healing well, edema resolving DVT Evaluation: Negative Homan's sign. No significant calf/ankle edema.  Discharge Diagnoses: Term Pregnancy-delivered  Discharge Information: Date: 08/06/2012 Activity: pelvic rest Diet: routine Medications: PNV, Ibuprofen and Percocet Condition: stable Instructions: refer to practice specific booklet Discharge to: home Follow-up Information    Follow up with Lenoard Aden, MD. Schedule an appointment as soon as possible for a visit in 6 weeks.   Contact information:   [address] [place] [address] [phone]          Newborn Data: Live born female  Birth Weight: 7 lb 2.2 oz (3238 g) APGAR: 8, 9  Home with mother.  Sherry Carr 08/06/2012, 10:33 AM

## 2012-08-11 ENCOUNTER — Inpatient Hospital Stay (HOSPITAL_COMMUNITY): Admission: AD | Admit: 2012-08-11 | Payer: Self-pay | Source: Ambulatory Visit | Admitting: Obstetrics and Gynecology

## 2013-10-05 ENCOUNTER — Other Ambulatory Visit: Payer: Self-pay

## 2014-02-23 ENCOUNTER — Other Ambulatory Visit: Payer: Self-pay | Admitting: Obstetrics and Gynecology

## 2014-02-26 ENCOUNTER — Encounter (HOSPITAL_COMMUNITY): Payer: Self-pay | Admitting: Pharmacist

## 2014-02-28 ENCOUNTER — Encounter (HOSPITAL_COMMUNITY)
Admission: RE | Admit: 2014-02-28 | Discharge: 2014-02-28 | Disposition: A | Discharge status: Home / Self Care | Payer: BC Managed Care – PPO | Source: Ambulatory Visit | Attending: Obstetrics and Gynecology | Admitting: Obstetrics and Gynecology

## 2014-02-28 ENCOUNTER — Encounter (HOSPITAL_COMMUNITY): Payer: Self-pay

## 2014-02-28 DIAGNOSIS — Z01812 Encounter for preprocedural laboratory examination: Secondary | ICD-10-CM | POA: Insufficient documentation

## 2014-02-28 LAB — CBC
HEMATOCRIT: 37.2 % (ref 36.0–46.0)
HEMOGLOBIN: 12.7 g/dL (ref 12.0–15.0)
MCH: 31.8 pg (ref 26.0–34.0)
MCHC: 34.1 g/dL (ref 30.0–36.0)
MCV: 93 fL (ref 78.0–100.0)
Platelets: 241 10*3/uL (ref 150–400)
RBC: 4 MIL/uL (ref 3.87–5.11)
RDW: 12.5 % (ref 11.5–15.5)
WBC: 9.6 10*3/uL (ref 4.0–10.5)

## 2014-02-28 NOTE — Patient Instructions (Signed)
20 Alcide Goodnessshley Camargo  02/28/2014   Your procedure is scheduled on:  03/07/14  Enter through the Main Entrance of [facility] at 1130 AM.  Pick up the phone at the desk and dial [phone].   Call this number if you have problems the morning of surgery: [phone]   Remember:   Do not eat food:After Midnight.  Do not drink clear liquids: 6 Hours before arrival.  Take these medicines the morning of surgery with A SIP OF WATER: NA   Do not wear jewelry, make-up or nail polish.  Do not wear lotions, powders, or perfumes. You may wear deodorant.  Do not shave 48 hours prior to surgery.  Do not bring valuables to the hospital.  [facility] is not   responsible for any belongings or valuables brought to the hospital.  Contacts, dentures or bridgework may not be worn into surgery.  Leave suitcase in the car. After surgery it may be brought to your room.  For patients admitted to the hospital, checkout time is 11:00 AM the day of              discharge.   Patients discharged the day of surgery will not be allowed to drive             home.  Name and phone number of your driver: husband  Special Instructions:      Please read over the following fact sheets that you were given:   Surgical Site Infection Prevention

## 2014-03-06 MED ORDER — CEFAZOLIN SODIUM-DEXTROSE 2-3 GM-% IV SOLR
2.0000 g | INTRAVENOUS | Status: AC
  Administered 2014-03-07: 2 g via INTRAVENOUS

## 2014-03-06 NOTE — H&P (Signed)
NAMAliene Beams:  Sherry Carr, Sherry Carr                ACCOUNT NO.:  [id number]  MEDICAL RECORD NO.:  [id number]  LOCATION:  PERIO                         FACILITY:  [facility]  PHYSICIAN:  Lenoard Adenichard J. Telsa Dillavou, M.D.DATE OF BIRTH:  21-Nov-1986  DATE OF ADMISSION:  01/17/2014 DATE OF DISCHARGE:                             HISTORY & PHYSICAL   CHIEF COMPLAINT:  Persistent pelvic pain and dysmenorrhea.  HISTORY OF PRESENT ILLNESS:  She is a 28 year old white female, G2, P1, with a previous history of endometriosis, laparoscopic fulguration of endometriosis in 2011, presents for followup evaluation and therapy.  MEDICATIONS:  Flonase.  ALLERGIES:  No known drug allergies.  SOCIAL HISTORY:  Noncontributory.  PREGNANCY HISTORY:  One uncomplicated spontaneous abortion and one uncomplicated vaginal delivery.  SURGICAL HISTORY:  Tonsillectomy and laparoscopy for endometriosis.  PHYSICAL EXAMINATION:  GENERAL:  She is a well-developed, well- nourished, white female, in no acute distress. HEENT:  Normal. NECK:  Supple.  Full range of motion. LUNGS:  Clear. HEART:  Regular rate and rhythm. ABDOMEN:  Soft, nontender. PELVIC:  Normal size uterus.  No adnexal masses. EXTREMITIES:  There are no cords. NEUROLOGIC: Nonfocal. SKIN:  Intact.  IMPRESSION:  Refractory recurrent dysmenorrhea and menorrhagia with history of pelvic endometriosis, status post fulguration in 2011.  PLAN:  Proceed with diagnostic laparoscopy, possible da Vinci-assisted excision or removal of endometriosis.  Risks of anesthesia, infection, bleeding, injury to surrounding organs, possible need for repair is discussed.  Delayed versus immediate complications are discussed.  The patient acknowledges and wishes to proceed.  Inability to cure all pain has been noted.  Consents were signed.     Lenoard Adenichard J. Ronin Rehfeldt, M.D.     RJT/MEDQ  D:  03/06/2014  T:  03/06/2014  Job:  [id number]

## 2014-03-07 ENCOUNTER — Encounter (HOSPITAL_COMMUNITY): Payer: BC Managed Care – PPO | Admitting: Anesthesiology

## 2014-03-07 ENCOUNTER — Ambulatory Visit (HOSPITAL_COMMUNITY)
Admission: RE | Admit: 2014-03-07 | Discharge: 2014-03-07 | Disposition: A | Discharge status: Home / Self Care | Payer: BC Managed Care – PPO | Source: Ambulatory Visit | Attending: Obstetrics and Gynecology | Admitting: Obstetrics and Gynecology

## 2014-03-07 ENCOUNTER — Encounter (HOSPITAL_COMMUNITY): Payer: Self-pay | Admitting: *Deleted

## 2014-03-07 ENCOUNTER — Encounter (HOSPITAL_COMMUNITY)
Admission: RE | Disposition: A | Discharge status: Home / Self Care | Payer: Self-pay | Source: Ambulatory Visit | Attending: Obstetrics and Gynecology

## 2014-03-07 ENCOUNTER — Ambulatory Visit (HOSPITAL_COMMUNITY): Payer: BC Managed Care – PPO | Admitting: Anesthesiology

## 2014-03-07 DIAGNOSIS — N803 Endometriosis of pelvic peritoneum, unspecified: Secondary | ICD-10-CM | POA: Insufficient documentation

## 2014-03-07 DIAGNOSIS — N80109 Endometriosis of ovary, unspecified side, unspecified depth: Secondary | ICD-10-CM

## 2014-03-07 DIAGNOSIS — N946 Dysmenorrhea, unspecified: Secondary | ICD-10-CM | POA: Insufficient documentation

## 2014-03-07 DIAGNOSIS — N920 Excessive and frequent menstruation with regular cycle: Secondary | ICD-10-CM | POA: Insufficient documentation

## 2014-03-07 DIAGNOSIS — N801 Endometriosis of ovary: Secondary | ICD-10-CM

## 2014-03-07 DIAGNOSIS — Z8742 Personal history of other diseases of the female genital tract: Secondary | ICD-10-CM | POA: Insufficient documentation

## 2014-03-07 DIAGNOSIS — N949 Unspecified condition associated with female genital organs and menstrual cycle: Secondary | ICD-10-CM | POA: Insufficient documentation

## 2014-03-07 HISTORY — PX: LAPAROSCOPY: SHX197

## 2014-03-07 HISTORY — PX: ROBOTIC ASSISTED LAPAROSCOPIC LYSIS OF ADHESION: SHX6080

## 2014-03-07 LAB — HCG, SERUM, QUALITATIVE: Preg, Serum: NEGATIVE

## 2014-03-07 SURGERY — LAPAROSCOPY, DIAGNOSTIC
Anesthesia: General | Site: Abdomen

## 2014-03-07 MED ORDER — PROMETHAZINE HCL 25 MG/ML IJ SOLN: 6.2500 mg | INTRAMUSCULAR | Status: DC | PRN

## 2014-03-07 MED ORDER — NEOSTIGMINE METHYLSULFATE 1 MG/ML IJ SOLN
INTRAMUSCULAR | Status: AC
  Filled 2014-03-07: qty 1

## 2014-03-07 MED ORDER — CEFAZOLIN SODIUM-DEXTROSE 2-3 GM-% IV SOLR
INTRAVENOUS | Status: AC
  Filled 2014-03-07: qty 50

## 2014-03-07 MED ORDER — BUPIVACAINE HCL (PF) 0.25 % IJ SOLN
INTRAMUSCULAR | Status: DC | PRN
  Administered 2014-03-07: 20 mL

## 2014-03-07 MED ORDER — FENTANYL CITRATE 0.05 MG/ML IJ SOLN
25.0000 ug | INTRAMUSCULAR | Status: DC | PRN
  Administered 2014-03-07 (×3): 50 ug via INTRAVENOUS

## 2014-03-07 MED ORDER — NEOSTIGMINE METHYLSULFATE 1 MG/ML IJ SOLN
INTRAMUSCULAR | Status: DC | PRN
  Administered 2014-03-07: 3 mg via INTRAVENOUS

## 2014-03-07 MED ORDER — OXYCODONE-ACETAMINOPHEN 5-325 MG PO TABS
1.0000 | ORAL_TABLET | ORAL | Status: DC | PRN
  Administered 2014-03-07: 1 via ORAL

## 2014-03-07 MED ORDER — ONDANSETRON HCL 4 MG/2ML IJ SOLN
INTRAMUSCULAR | Status: DC | PRN
  Administered 2014-03-07: 4 mg via INTRAVENOUS

## 2014-03-07 MED ORDER — ONDANSETRON HCL 4 MG/2ML IJ SOLN
INTRAMUSCULAR | Status: AC
  Filled 2014-03-07: qty 2

## 2014-03-07 MED ORDER — LIDOCAINE HCL (CARDIAC) 20 MG/ML IV SOLN
INTRAVENOUS | Status: DC | PRN
  Administered 2014-03-07: 50 mg via INTRAVENOUS

## 2014-03-07 MED ORDER — FENTANYL CITRATE 0.05 MG/ML IJ SOLN
INTRAMUSCULAR | Status: AC
  Administered 2014-03-07: 50 ug via INTRAVENOUS
  Filled 2014-03-07: qty 2

## 2014-03-07 MED ORDER — KETOROLAC TROMETHAMINE 30 MG/ML IJ SOLN
INTRAMUSCULAR | Status: DC | PRN
  Administered 2014-03-07: 30 mg via INTRAVENOUS

## 2014-03-07 MED ORDER — DEXAMETHASONE SODIUM PHOSPHATE 10 MG/ML IJ SOLN
INTRAMUSCULAR | Status: DC | PRN
  Administered 2014-03-07: 10 mg via INTRAVENOUS

## 2014-03-07 MED ORDER — SUFENTANIL CITRATE 50 MCG/ML IV SOLN
INTRAVENOUS | Status: AC
  Filled 2014-03-07: qty 1

## 2014-03-07 MED ORDER — MIDAZOLAM HCL 2 MG/2ML IJ SOLN
INTRAMUSCULAR | Status: AC
  Filled 2014-03-07: qty 2

## 2014-03-07 MED ORDER — LACTATED RINGERS IV SOLN
INTRAVENOUS | Status: DC
  Administered 2014-03-07 (×3): via INTRAVENOUS

## 2014-03-07 MED ORDER — ROCURONIUM BROMIDE 100 MG/10ML IV SOLN
INTRAVENOUS | Status: DC | PRN
  Administered 2014-03-07: 50 mg via INTRAVENOUS
  Administered 2014-03-07: 10 mg via INTRAVENOUS

## 2014-03-07 MED ORDER — GLYCOPYRROLATE 0.2 MG/ML IJ SOLN
INTRAMUSCULAR | Status: AC
  Filled 2014-03-07: qty 4

## 2014-03-07 MED ORDER — LIDOCAINE HCL (CARDIAC) 20 MG/ML IV SOLN
INTRAVENOUS | Status: AC
  Filled 2014-03-07: qty 5

## 2014-03-07 MED ORDER — OXYCODONE-ACETAMINOPHEN 5-325 MG PO TABS
ORAL_TABLET | ORAL | Status: AC
  Filled 2014-03-07: qty 1

## 2014-03-07 MED ORDER — MIDAZOLAM HCL 5 MG/5ML IJ SOLN
INTRAMUSCULAR | Status: DC | PRN
  Administered 2014-03-07: 2 mg via INTRAVENOUS

## 2014-03-07 MED ORDER — DEXAMETHASONE SODIUM PHOSPHATE 10 MG/ML IJ SOLN
INTRAMUSCULAR | Status: AC
  Filled 2014-03-07: qty 1

## 2014-03-07 MED ORDER — KETOROLAC TROMETHAMINE 30 MG/ML IJ SOLN
INTRAMUSCULAR | Status: AC
  Filled 2014-03-07: qty 1

## 2014-03-07 MED ORDER — PROPOFOL 10 MG/ML IV BOLUS
INTRAVENOUS | Status: DC | PRN
  Administered 2014-03-07 (×5): 10 mg via INTRAVENOUS
  Administered 2014-03-07: 150 mg via INTRAVENOUS

## 2014-03-07 MED ORDER — FENTANYL CITRATE 0.05 MG/ML IJ SOLN
INTRAMUSCULAR | Status: AC
  Filled 2014-03-07: qty 2

## 2014-03-07 MED ORDER — SUFENTANIL CITRATE 50 MCG/ML IV SOLN
INTRAVENOUS | Status: DC | PRN
  Administered 2014-03-07 (×3): 10 ug via INTRAVENOUS
  Administered 2014-03-07: 20 ug via INTRAVENOUS

## 2014-03-07 MED ORDER — OXYCODONE-ACETAMINOPHEN 5-325 MG PO TABS: 1.0000 | ORAL_TABLET | ORAL | Status: DC | PRN

## 2014-03-07 MED ORDER — HEPARIN SODIUM (PORCINE) 5000 UNIT/ML IJ SOLN
INTRAMUSCULAR | Status: AC
  Filled 2014-03-07: qty 1

## 2014-03-07 MED ORDER — GLYCOPYRROLATE 0.2 MG/ML IJ SOLN
INTRAMUSCULAR | Status: DC | PRN
  Administered 2014-03-07: 0.6 mg via INTRAVENOUS

## 2014-03-07 MED ORDER — PROPOFOL 10 MG/ML IV EMUL
INTRAVENOUS | Status: AC
  Filled 2014-03-07: qty 20

## 2014-03-07 MED ORDER — ARTIFICIAL TEARS OP OINT
TOPICAL_OINTMENT | OPHTHALMIC | Status: DC | PRN
  Administered 2014-03-07: 1 via OPHTHALMIC

## 2014-03-07 SURGICAL SUPPLY — 75 items
BAG URINE DRAINAGE (UROLOGICAL SUPPLIES) ×3 IMPLANT
BARRIER ADHS 3X4 INTERCEED (GAUZE/BANDAGES/DRESSINGS) IMPLANT
CABLE HIGH FREQUENCY MONO STRZ (ELECTRODE) IMPLANT
CATH FOLEY 3WAY  5CC 16FR (CATHETERS) ×2
CATH FOLEY 3WAY 5CC 16FR (CATHETERS) ×1 IMPLANT
CATH ROBINSON RED A/P 16FR (CATHETERS) IMPLANT
CHLORAPREP W/TINT 26ML (MISCELLANEOUS) ×3 IMPLANT
CLOTH BEACON ORANGE TIMEOUT ST (SAFETY) ×3 IMPLANT
CONT PATH 16OZ SNAP LID 3702 (MISCELLANEOUS) ×3 IMPLANT
COVER MAYO STAND STRL (DRAPES) ×3 IMPLANT
COVER TABLE BACK 60X90 (DRAPES) ×6 IMPLANT
COVER TIP SHEARS 8 DVNC (MISCELLANEOUS) ×1 IMPLANT
COVER TIP SHEARS 8MM DA VINCI (MISCELLANEOUS) ×2
DECANTER SPIKE VIAL GLASS SM (MISCELLANEOUS) ×3 IMPLANT
DERMABOND ADVANCED (GAUZE/BANDAGES/DRESSINGS) ×2
DERMABOND ADVANCED .7 DNX12 (GAUZE/BANDAGES/DRESSINGS) ×1 IMPLANT
DRAPE HUG U DISPOSABLE (DRAPE) ×3 IMPLANT
DRAPE LG THREE QUARTER DISP (DRAPES) ×6 IMPLANT
DRAPE WARM FLUID 44X44 (DRAPE) ×3 IMPLANT
ELECT REM PT RETURN 9FT ADLT (ELECTROSURGICAL) ×3
ELECTRODE REM PT RTRN 9FT ADLT (ELECTROSURGICAL) ×1 IMPLANT
EVACUATOR SMOKE 8.L (FILTER) ×3 IMPLANT
FORCEPS CUTTING 33CM 5MM (CUTTING FORCEPS) IMPLANT
FORCEPS CUTTING 45CM 5MM (CUTTING FORCEPS) IMPLANT
GAS CARTRIDGE (MEDICAL GASES) IMPLANT
GAUZE VASELINE 3X9 (GAUZE/BANDAGES/DRESSINGS) IMPLANT
GLOVE BIO SURGEON STRL SZ7.5 (GLOVE) ×6 IMPLANT
GOWN STRL REUS W/TWL LRG LVL3 (GOWN DISPOSABLE) ×21 IMPLANT
GYRUS RUMI II 2.5CM BLUE (DISPOSABLE)
GYRUS RUMI II 3.5CM BLUE (DISPOSABLE)
GYRUS RUMI II 4.0CM BLUE (DISPOSABLE)
KIT ACCESSORY DA VINCI DISP (KITS) ×2
KIT ACCESSORY DVNC DISP (KITS) ×1 IMPLANT
LEGGING LITHOTOMY PAIR STRL (DRAPES) ×3 IMPLANT
NEEDLE INSUFFLATION 120MM (ENDOMECHANICALS) ×3 IMPLANT
NEEDLE INSUFFLATION 150MM (ENDOMECHANICALS) ×3 IMPLANT
PACK LAPAROSCOPY BASIN (CUSTOM PROCEDURE TRAY) ×3 IMPLANT
PACK LAVH (CUSTOM PROCEDURE TRAY) ×3 IMPLANT
PAD PREP 24X48 CUFFED NSTRL (MISCELLANEOUS) ×6 IMPLANT
PLUG CATH AND CAP STER (CATHETERS) ×3 IMPLANT
PROTECTOR NERVE ULNAR (MISCELLANEOUS) ×6 IMPLANT
RUMI II 3.0CM BLUE KOH-EFFICIE (DISPOSABLE) IMPLANT
RUMI II GYRUS 2.5CM BLUE (DISPOSABLE) IMPLANT
RUMI II GYRUS 3.5CM BLUE (DISPOSABLE) IMPLANT
RUMI II GYRUS 4.0CM BLUE (DISPOSABLE) IMPLANT
SET CYSTO W/LG BORE CLAMP LF (SET/KITS/TRAYS/PACK) IMPLANT
SET IRRIG TUBING LAPAROSCOPIC (IRRIGATION / IRRIGATOR) ×3 IMPLANT
SOLUTION ELECTROLUBE (MISCELLANEOUS) ×3 IMPLANT
SUT VIC AB 0 CT1 27 (SUTURE) ×4
SUT VIC AB 0 CT1 27XBRD ANBCTR (SUTURE) ×2 IMPLANT
SUT VIC AB 0 CT1 27XBRD ANTBC (SUTURE) IMPLANT
SUT VICRYL 0 UR6 27IN ABS (SUTURE) ×3 IMPLANT
SUT VICRYL 4-0 PS2 18IN ABS (SUTURE) ×6 IMPLANT
SUT VICRYL RAPIDE 4/0 PS 2 (SUTURE) ×6 IMPLANT
SYR 50ML LL SCALE MARK (SYRINGE) ×3 IMPLANT
SYRINGE 10CC LL (SYRINGE) ×3 IMPLANT
SYSTEM CONVERTIBLE TROCAR (TROCAR) IMPLANT
TAPE UMBILICAL 1/8 X36 TWILL (MISCELLANEOUS) IMPLANT
TIP UTERINE 5.1X6CM LAV DISP (MISCELLANEOUS) IMPLANT
TIP UTERINE 6.7X10CM GRN DISP (MISCELLANEOUS) IMPLANT
TIP UTERINE 6.7X6CM WHT DISP (MISCELLANEOUS) IMPLANT
TIP UTERINE 6.7X8CM BLUE DISP (MISCELLANEOUS) IMPLANT
TOWEL OR 17X24 6PK STRL BLUE (TOWEL DISPOSABLE) ×9 IMPLANT
TRAY FOLEY CATH 14FR (SET/KITS/TRAYS/PACK) ×3 IMPLANT
TROCAR DISP BLADELESS 8 DVNC (TROCAR) ×1 IMPLANT
TROCAR DISP BLADELESS 8MM (TROCAR) ×2
TROCAR OPTI TIP 5M 100M (ENDOMECHANICALS) ×3 IMPLANT
TROCAR XCEL 12X100 BLDLESS (ENDOMECHANICALS) IMPLANT
TROCAR XCEL DIL TIP R 11M (ENDOMECHANICALS) ×3 IMPLANT
TROCAR XCEL NON-BLD 5MMX100MML (ENDOMECHANICALS) ×3 IMPLANT
TROCAR XCEL OPT SLVE 5M 100M (ENDOMECHANICALS) IMPLANT
TROCAR Z-THREAD 12X150 (TROCAR) ×3 IMPLANT
TUBING FILTER THERMOFLATOR (ELECTROSURGICAL) ×3 IMPLANT
WARMER LAPAROSCOPE (MISCELLANEOUS) ×3 IMPLANT
WATER STERILE IRR 1000ML POUR (IV SOLUTION) ×9 IMPLANT

## 2014-03-07 NOTE — Progress Notes (Signed)
Patient ID: Alcide Goodnessshley Marbach, female   DOB: 1986-05-01, 28 y.o.   MRN: [id number] Patient seen and examined. Consent witnessed and signed. No changes noted. Update completed. CBC    Component Value Date/Time   WBC 9.6 02/28/2014 1448   RBC 4.00 02/28/2014 1448   HGB 12.7 02/28/2014 1448   HCT 37.2 02/28/2014 1448   PLT 241 02/28/2014 1448   MCV 93.0 02/28/2014 1448   MCH 31.8 02/28/2014 1448   MCHC 34.1 02/28/2014 1448   RDW 12.5 02/28/2014 1448   LYMPHSABS 1.7 10/14/2011 2138   MONOABS 1.2* 10/14/2011 2138   EOSABS 0.1 10/14/2011 2138   BASOSABS 0.0 10/14/2011 2138

## 2014-03-07 NOTE — Op Note (Signed)
03/07/2014  2:35 PM  PATIENT:  Alcide GoodnessAshley Oatis  28 y.o. female  PRE-OPERATIVE DIAGNOSIS:  Endometriosis  POST-OPERATIVE DIAGNOSIS:  Endometriosis, Recurrent  PROCEDURE:  Procedure(s): LAPAROSCOPY DIAGNOSTIC ROBOTIC ASSISTED LAPAROSCOPIC LYSIS OF ADHESION;  Ablation of Endometriosis Excision of endometriosis  SURGEON:  Surgeon(s): Lenoard Adenichard J Labrian Torregrossa, MD  ASSISTANTS: Fredric MareBailey, CNM   ANESTHESIA:   local and general  ESTIMATED BLOOD LOSS: * No blood loss amount entered *   DRAINS: Urinary Catheter (Foley)   LOCAL MEDICATIONS USED:  MARCAINE    and Amount: 30 ml  SPECIMEN:  Source of Specimen:  peritoneal implants  DISPOSITION OF SPECIMEN:  PATHOLOGY  COUNTS:  YES  DICTATION #: [id number]  PLAN OF CARE: DC home  PATIENT DISPOSITION:  PACU - hemodynamically stable.

## 2014-03-07 NOTE — Transfer of Care (Signed)
Immediate Anesthesia Transfer of Care Note  Patient: Sherry Carr  Procedure(s) Performed: Procedure(s): LAPAROSCOPY DIAGNOSTIC (N/A) ROBOTIC ASSISTED LAPAROSCOPIC LYSIS OF ADHESION; Ablation of Endometriosis (N/A)  Patient Location: PACU  Anesthesia Type:General  Level of Consciousness: awake  Airway & Oxygen Therapy: Patient Spontanous Breathing and Patient connected to nasal cannula oxygen  Post-op Assessment: Report given to PACU RN and Post -op Vital signs reviewed and stable  Post vital signs: stable  Complications: No apparent anesthesia complications

## 2014-03-07 NOTE — Anesthesia Postprocedure Evaluation (Signed)
  Anesthesia Post-op Note  Patient: Sherry Carr  Procedure(s) Performed: Procedure(s): LAPAROSCOPY DIAGNOSTIC (N/A) ROBOTIC ASSISTED LAPAROSCOPIC LYSIS OF ADHESION; Ablation of Endometriosis (N/A)  Patient Location: PACU  Anesthesia Type:General  Level of Consciousness: awake, alert  and oriented  Airway and Oxygen Therapy: Patient Spontanous Breathing  Post-op Pain: mild  Post-op Assessment: Post-op Vital signs reviewed, Patient's Cardiovascular Status Stable, Respiratory Function Stable, Patent Airway, No signs of Nausea or vomiting and Pain level controlled  Post-op Vital Signs: Reviewed and stable  Last Vitals:  Filed Vitals:   03/07/14 1530  BP: 96/56  Pulse: 69  Temp:   Resp: 12    Complications: No apparent anesthesia complications

## 2014-03-07 NOTE — Discharge Instructions (Signed)
DISCHARGE INSTRUCTIONS: Laparoscopy  MAY TAKE IBUPROFEN (ADVIL, MOTRIN) OR ALEVE AFTER 8:20 PM FOR CRAMPS!!!!!  The following instructions have been prepared to help you care for yourself upon your return home today.  Wound care:  Do not get the incision wet for the first 24 hours. The incision should be kept clean and dry.  The Band-Aids or dressings may be removed the day after surgery.  Should the incision become sore, red, and swollen after the first week, check with your doctor.  Personal hygiene:  Shower the day after your procedure.  Activity and limitations:  Do NOT drive or operate any equipment today.  Do NOT lift anything more than 15 pounds for 2-3 weeks after surgery.  Do NOT rest in bed all day.  Walking is encouraged. Walk each day, starting slowly with 5-minute walks 3 or 4 times a day. Slowly increase the length of your walks.  Walk up and down stairs slowly.  Do NOT do strenuous activities, such as golfing, playing tennis, bowling, running, biking, weight lifting, gardening, mowing, or vacuuming for 2-4 weeks. Ask your doctor when it is okay to start.  Diet: Eat a light meal as desired this evening. You may resume your usual diet tomorrow.  Return to work: This is dependent on the type of work you do. For the most part you can return to a desk job within a week of surgery. If you are more active at work, please discuss this with your doctor.  What to expect after your surgery: You may have a slight burning sensation when you urinate on the first day. You may have a very small amount of blood in the urine. Expect to have a small amount of vaginal discharge/light bleeding for 1-2 weeks. It is not unusual to have abdominal soreness and bruising for up to 2 weeks. You may be tired and need more rest for about 1 week. You may experience shoulder pain for 24-72 hours. Lying flat in bed may relieve it.    Call your doctor for any of the following:  Develop a fever  of 100.4 or greater  Inability to urinate 6 hours after discharge from hospital  Severe pain not relieved by pain medications  Persistent of heavy bleeding at incision site  Redness or swelling around incision site after a week  Increasing nausea or vomiting  Patient Signature________________________________________ Nurse Signature_________________________________________

## 2014-03-07 NOTE — Anesthesia Preprocedure Evaluation (Signed)
Anesthesia Evaluation  Patient identified by MRN, date of birth, ID band Patient awake    Reviewed: Allergy & Precautions, H&P , NPO status , Patient's Chart, lab work & pertinent test results  History of Anesthesia Complications Negative for: history of anesthetic complications  Airway Mallampati: II TM Distance: >3 FB Neck ROM: Full    Dental  (+) Teeth Intact,    Pulmonary neg pulmonary ROS,  breath sounds clear to auscultation        Cardiovascular negative cardio ROS  Rhythm:Regular Rate:Normal     Neuro/Psych negative neurological ROS  negative psych ROS   GI/Hepatic negative GI ROS, Neg liver ROS,   Endo/Other  negative endocrine ROS  Renal/GU negative Renal ROS     Musculoskeletal negative musculoskeletal ROS (+)   Abdominal   Peds  Hematology negative hematology ROS (+)   Anesthesia Other Findings endometriosis  Reproductive/Obstetrics                           Anesthesia Physical Anesthesia Plan  ASA: I  Anesthesia Plan: General   Post-op Pain Management:    Induction: Intravenous  Airway Management Planned: Oral ETT  Additional Equipment: None  Intra-op Plan:   Post-operative Plan: Extubation in OR  Informed Consent: I have reviewed the patients History and Physical, chart, labs and discussed the procedure including the risks, benefits and alternatives for the proposed anesthesia with the patient or authorized representative who has indicated his/her understanding and acceptance.   Dental advisory given  Plan Discussed with: CRNA and Surgeon  Anesthesia Plan Comments:         Anesthesia Quick Evaluation

## 2014-03-08 ENCOUNTER — Encounter (HOSPITAL_COMMUNITY): Payer: Self-pay | Admitting: Obstetrics and Gynecology

## 2014-03-08 NOTE — Op Note (Signed)
NAMAliene Beams:  Carr, Sherry Carr                ACCOUNT NO.:  [id number]  MEDICAL RECORD NO.:  [id number]  LOCATION:  [facility]                          FACILITY:  [facility]  PHYSICIAN:  Lenoard Adenichard J. Charlett Merkle, M.D.DATE OF BIRTH:  1986-07-26  DATE OF PROCEDURE:  03/07/2014 DATE OF DISCHARGE:  03/07/2014                              OPERATIVE REPORT   PREOPERATIVE DIAGNOSES:  Pelvic pain, dysmenorrhea, dyspareunia with a history of endometriosis.  POSTOPERATIVE DIAGNOSES:  Recurrent pelvic endometriosis.  PROCEDURE:  DaVinci-assisted laparoscopic lysis of adhesions, ablation of bilateral ovarian surface endometriosis, excision of cul-de-sac endometriosis.  SURGEON:  Lenoard Adenichard J. Santos Hardwick, M.D.  ASSISTANT:  Marlinda Mikeanya Bailey, C.N.M.  ESTIMATED BLOOD LOSS:  Less than 50 mL.  DRAINS:  Foley.  COUNTS:  Correct.  DISPOSITION:  The patient to recovery in good condition.  SPECIMENS:  Peritoneal endometriotic implants to Pathology.  BRIEF OPERATIVE NOTE:  After being apprised of the risks of anesthesia, infection, bleeding, injury to surrounding organs, possible need for repair, delayed versus immediate complications to include bowel and bladder injury, possible need for repair, the patient was brought to the operating room, where she was administered general anesthetic without complications.  Prepped and draped in usual sterile fashion.  Feet were placed in Yellofin stirrups.  Exam under anesthesia reveals an mid- position uterus with no adnexal masses appreciated.  At this time, RUMI retractor was placed vaginally, Foley catheter has been placed. Infraumbilical incision made with a scalpel.  Veress needle placed, opening pressure -2.  3-1/2 L of CO2 insufflated without difficulty. Visualization reveals a normal liver, gallbladder bed, normal diaphragm, and normal appendix.  Both ovaries were coated with superficial vesicular endometriotic lesions and no evidence of endometrioma.  Both tubes appear normal.  The  anterior cul-de-sac was normal.  Posterior cul- de-sac has multiple vesicular implants, 2 master's windows along the right and mid cul-de-sac, and some bluish powder burned tinged areas along the left peritoneal sidewall.  The ureters were then identified bilaterally, the robotic ports were placed on the right and left and an assistant port on the left.  The robot was docked after achieving steep Trendelenburg position.  At this time, using PK forceps and Endo-Shears, the left peritoneal implant is excised in total, the cul-de-sac peritoneal windows are everted and excised totally; all sent to Pathology.  The uterosacral endometriosis, which is the surface and vesicular is cauterized bilaterally.  The ovarian endometriosis is fulgurated with monopolar cautery as well.  At the end of the procedure, due to the multiple peritoneal defects, a layer of Interceed is placed in the cul-de-sac over the peritoneal dissection.  Good hemostasis was achieved.  All instruments were removed.  The robot was undocked. CO2 was released.  Incisions were closed using 0-Vicryl, 4-0 Vicryl, and Dermabond.  RUMI retractor was removed.  The patient tolerated the procedure well, was awakened, and transferred to recovery in good condition.     Lenoard Adenichard J. [name], M.D.     RJT/MEDQ  D:  03/07/2014  T:  03/08/2014  Job:  [id number]

## 2014-04-24 NOTE — OR Nursing (Signed)
PROCEDURE END TIME 1430

## 2014-10-01 ENCOUNTER — Encounter (HOSPITAL_COMMUNITY): Payer: Self-pay | Admitting: Obstetrics and Gynecology

## 2014-11-05 NOTE — OR Nursing (Signed)
Late entry procedure end 1428

## 2014-11-09 ENCOUNTER — Emergency Department (HOSPITAL_COMMUNITY)
Admission: EM | Admit: 2014-11-09 | Discharge: 2014-11-09 | Disposition: A | Discharge status: Home / Self Care | Payer: BC Managed Care – PPO | Attending: Emergency Medicine | Admitting: Emergency Medicine

## 2014-11-09 ENCOUNTER — Emergency Department (HOSPITAL_COMMUNITY): Payer: BC Managed Care – PPO

## 2014-11-09 ENCOUNTER — Encounter (HOSPITAL_COMMUNITY): Payer: Self-pay | Admitting: *Deleted

## 2014-11-09 DIAGNOSIS — R05 Cough: Secondary | ICD-10-CM | POA: Diagnosis not present

## 2014-11-09 DIAGNOSIS — R109 Unspecified abdominal pain: Secondary | ICD-10-CM

## 2014-11-09 DIAGNOSIS — R1084 Generalized abdominal pain: Secondary | ICD-10-CM | POA: Diagnosis not present

## 2014-11-09 DIAGNOSIS — Z3202 Encounter for pregnancy test, result negative: Secondary | ICD-10-CM | POA: Insufficient documentation

## 2014-11-09 DIAGNOSIS — Z8742 Personal history of other diseases of the female genital tract: Secondary | ICD-10-CM | POA: Diagnosis not present

## 2014-11-09 DIAGNOSIS — R112 Nausea with vomiting, unspecified: Secondary | ICD-10-CM | POA: Diagnosis not present

## 2014-11-09 DIAGNOSIS — Z8619 Personal history of other infectious and parasitic diseases: Secondary | ICD-10-CM | POA: Diagnosis not present

## 2014-11-09 DIAGNOSIS — R1031 Right lower quadrant pain: Secondary | ICD-10-CM

## 2014-11-09 DIAGNOSIS — R197 Diarrhea, unspecified: Secondary | ICD-10-CM | POA: Insufficient documentation

## 2014-11-09 LAB — CBC WITH DIFFERENTIAL/PLATELET
Basophils Absolute: 0 10*3/uL (ref 0.0–0.1)
Basophils Relative: 0 % (ref 0–1)
EOS ABS: 0.1 10*3/uL (ref 0.0–0.7)
Eosinophils Relative: 1 % (ref 0–5)
HCT: 39.5 % (ref 36.0–46.0)
HEMOGLOBIN: 13.3 g/dL (ref 12.0–15.0)
LYMPHS ABS: 0.7 10*3/uL (ref 0.7–4.0)
Lymphocytes Relative: 7 % — ABNORMAL LOW (ref 12–46)
MCH: 30.9 pg (ref 26.0–34.0)
MCHC: 33.7 g/dL (ref 30.0–36.0)
MCV: 91.9 fL (ref 78.0–100.0)
MONO ABS: 0.5 10*3/uL (ref 0.1–1.0)
MONOS PCT: 4 % (ref 3–12)
NEUTROS PCT: 88 % — AB (ref 43–77)
Neutro Abs: 9.3 10*3/uL — ABNORMAL HIGH (ref 1.7–7.7)
Platelets: 242 10*3/uL (ref 150–400)
RBC: 4.3 MIL/uL (ref 3.87–5.11)
RDW: 12.4 % (ref 11.5–15.5)
WBC: 10.5 10*3/uL (ref 4.0–10.5)

## 2014-11-09 LAB — URINALYSIS, ROUTINE W REFLEX MICROSCOPIC
BILIRUBIN URINE: NEGATIVE
Glucose, UA: NEGATIVE mg/dL
Ketones, ur: NEGATIVE mg/dL
NITRITE: NEGATIVE
PH: 7 (ref 5.0–8.0)
Protein, ur: NEGATIVE mg/dL
Specific Gravity, Urine: 1.021 (ref 1.005–1.030)
Urobilinogen, UA: 0.2 mg/dL (ref 0.0–1.0)

## 2014-11-09 LAB — BASIC METABOLIC PANEL
Anion gap: 13 (ref 5–15)
BUN: 11 mg/dL (ref 6–23)
CO2: 25 mEq/L (ref 19–32)
CREATININE: 0.61 mg/dL (ref 0.50–1.10)
Calcium: 9.1 mg/dL (ref 8.4–10.5)
Chloride: 102 mEq/L (ref 96–112)
GFR calc non Af Amer: >90 mL/min (ref >90)
Glucose, Bld: 92 mg/dL (ref 70–99)
POTASSIUM: 4 meq/L (ref 3.7–5.3)
Sodium: 140 mEq/L (ref 137–147)

## 2014-11-09 LAB — POC URINE PREG, ED: Preg Test, Ur: NEGATIVE

## 2014-11-09 LAB — URINE MICROSCOPIC-ADD ON

## 2014-11-09 MED ORDER — SODIUM CHLORIDE 0.9 % IV BOLUS (SEPSIS)
1000.0000 mL | Freq: Once | INTRAVENOUS | Status: AC
  Administered 2014-11-09: 1000 mL via INTRAVENOUS

## 2014-11-09 MED ORDER — IOHEXOL 300 MG/ML  SOLN
100.0000 mL | Freq: Once | INTRAMUSCULAR | Status: AC | PRN
  Administered 2014-11-09: 100 mL via INTRAVENOUS

## 2014-11-09 MED ORDER — MORPHINE SULFATE 4 MG/ML IJ SOLN
4.0000 mg | Freq: Once | INTRAMUSCULAR | Status: AC
  Administered 2014-11-09: 4 mg via INTRAVENOUS
  Filled 2014-11-09: qty 1

## 2014-11-09 MED ORDER — ONDANSETRON HCL 4 MG/2ML IJ SOLN
4.0000 mg | Freq: Once | INTRAMUSCULAR | Status: AC
  Administered 2014-11-09: 4 mg via INTRAVENOUS
  Filled 2014-11-09: qty 2

## 2014-11-09 MED ORDER — IOHEXOL 300 MG/ML  SOLN: 25.0000 mL | Freq: Once | INTRAMUSCULAR | Status: DC | PRN

## 2014-11-09 MED ORDER — OXYCODONE-ACETAMINOPHEN 5-325 MG PO TABS: 1.0000 | ORAL_TABLET | ORAL | Status: DC | PRN

## 2014-11-09 NOTE — ED Notes (Signed)
NS liter bolus infusing before being discharged.

## 2014-11-09 NOTE — ED Notes (Signed)
Pt drinking contrast for CT.  Also received phone call from pt's sister stating that pt needed something stronger for pain because Morphine wasn't helping.

## 2014-11-09 NOTE — ED Provider Notes (Signed)
CSN: [id number]     Arrival date & time 11/09/14  1127 History   First MD Initiated Contact with Patient 11/09/14 1144     Chief Complaint  Patient presents with  . Abdominal Pain  . Emesis  . Diarrhea    Patient is a 28 y.o. female presenting with abdominal pain, vomiting, and diarrhea. The history is provided by the patient.  Abdominal Pain Pain location:  Suprapubic and RLQ Pain quality: aching and cramping   Pain radiates to:  Does not radiate Pain severity:  Moderate Onset quality:  Gradual Duration:  7 hours Timing:  Constant Progression:  Worsening Chronicity:  New Relieved by:  None tried Worsened by:  Nothing tried Associated symptoms: cough, diarrhea, nausea and vomiting   Associated symptoms: no dysuria, no fever, no vaginal bleeding and no vaginal discharge   Emesis Associated symptoms: abdominal pain and diarrhea   Diarrhea Associated symptoms: abdominal pain and vomiting   Associated symptoms: no fever   Patient reports one episode each of vomiting/diarrhea (nonbloody) She reports abdominal pain but some of it is c/w prior endometriosis  No sick contact No travel  Past Medical History  Diagnosis Date  . Endometriosis   . Pregnant   . Ovarian cyst   . H/O varicella   . Contact with or exposure to other viral diseases(V01.79)   . Normal labor 08/04/2012  . NSVD (normal spontaneous vaginal delivery, 2nd deg lac, 9/5) 08/05/2012  . Medical history non-contributory    Past Surgical History  Procedure Laterality Date  . Laparoscopic endometriosis fulguration    . Tonsillectomy    . Upper gastrointestinal endoscopy    . Colonoscopy    . Wisdom tooth extraction    . Laparoscopy N/A 03/07/2014    Procedure: LAPAROSCOPY DIAGNOSTIC;  Surgeon: Lenoard Adenichard J Taavon, MD;  Location: [facility];  Service: Gynecology;  Laterality: N/A;  . Robotic assisted laparoscopic lysis of adhesion N/A 03/07/2014    Procedure: ROBOTIC ASSISTED LAPAROSCOPIC LYSIS OF ADHESION; Ablation of  Endometriosis;  Surgeon: Lenoard Adenichard J Taavon, MD;  Location: [facility];  Service: Gynecology;  Laterality: N/A;   Family History  Problem Relation Age of Onset  . Adopted: Yes  . Anesthesia problems Neg Hx    History  Substance Use Topics  . Smoking status: Never Smoker   . Smokeless tobacco: Never Used  . Alcohol Use: No   OB History    Gravida Para Term Preterm AB TAB SAB Ectopic Multiple Living   2 1 1  1  1   1      Review of Systems  Constitutional: Negative for fever.  Respiratory: Positive for cough.   Gastrointestinal: Positive for nausea, vomiting, abdominal pain and diarrhea.  Genitourinary: Negative for dysuria, vaginal bleeding and vaginal discharge.  All other systems reviewed and are negative.     Allergies  Review of patient's allergies indicates no known allergies.  Home Medications   Prior to Admission medications   Medication Sig Start Date End Date Taking? Authorizing Provider  Misc. Devices (BREAST PUMP) MISC 1 Units by Does not apply route as needed. 08/06/12   Arlan Organaniela Paul, CNM  oxyCODONE-acetaminophen (ROXICET) 5-325 MG per tablet Take 1-2 tablets by mouth every 4 (four) hours as needed for severe pain. 03/07/14   Lenoard Adenichard J Taavon, MD   BP 100/81 mmHg  Pulse 109  Temp(Src) 98.2 F (36.8 C) (Oral)  Resp 18  SpO2 99%  LMP 10/11/2014 Physical Exam CONSTITUTIONAL: Well developed/well nourished HEAD: Normocephalic/atraumatic EYES: EOMI/PERRL ENMT:  Mucous membranes moist NECK: supple no meningeal signs SPINE/BACK:entire spine nontender CV: S1/S2 noted, no murmurs/rubs/gallops noted LUNGS: Lungs are clear to auscultation bilaterally, no apparent distress ABDOMEN: soft, mild suprapubic tenderness, no rebound or guarding, bowel sounds noted throughout abdomen GU:no cva tenderness NEURO: Pt is awake/alert/appropriate, moves all extremitiesx4.  No facial droop.   EXTREMITIES: pulses normal/equal, full ROM SKIN: warm, color normal PSYCH: no abnormalities of  mood noted, alert and oriented to situation  ED Course  Procedures   3:48 PM Pt with worsening diffuse lower abdominal pain that appears out of proportion to pain from gastroenteritis Pelvic exam reveals diffuse pelvic tenderness without mass (vag bleeding now noted) - female chaperone present Will obtain CT imaging 5:14 PM Imaging negative She is well appearing I doubt acute abd process and I doubt acute torsion She will f/u with GYN Will recheck BP prior to discharge Labs Review Labs Reviewed  URINALYSIS, ROUTINE W REFLEX MICROSCOPIC - Abnormal; Notable for the following:    Hgb urine dipstick MODERATE (*)    Leukocytes, UA SMALL (*)    All other components within normal limits  CBC WITH DIFFERENTIAL - Abnormal; Notable for the following:    Neutrophils Relative % 88 (*)    Neutro Abs 9.3 (*)    Lymphocytes Relative 7 (*)    All other components within normal limits  URINE MICROSCOPIC-ADD ON - Abnormal; Notable for the following:    Squamous Epithelial / LPF FEW (*)    Bacteria, UA FEW (*)    All other components within normal limits  BASIC METABOLIC PANEL  POC URINE PREG, ED    Imaging Review Ct Abdomen Pelvis W Contrast  11/09/2014   CLINICAL DATA:  Acute right lower quadrant abdominal pain.  EXAM: CT ABDOMEN AND PELVIS WITH CONTRAST  TECHNIQUE: Multidetector CT imaging of the abdomen and pelvis was performed using the standard protocol following bolus administration of intravenous contrast.  CONTRAST:  100 mL Omnipaque 300 intravenously.  COMPARISON:  CT scan of October 15, 2011.  FINDINGS: Visualized lung bases appear normal. No significant osseous abnormality is noted.  No gallstones are noted. The liver, spleen and pancreas appear normal. Adrenal glands and kidneys appear normal. No hydronephrosis or renal obstruction is noted. No renal or ureteral calculi are noted. There is no evidence of bowel obstruction. Appendix appears normal. There is no abnormal fluid  collection. Urinary bladder appears normal. Uterus and ovaries appear normal. No significant adenopathy is noted.  IMPRESSION: No acute abnormality seen in the abdomen or pelvis.   Electronically Signed   By: Roque Lias M.D.   On: 11/09/2014 16:37     Medications  iohexol (OMNIPAQUE) 300 MG/ML solution 25 mL (not administered)  ondansetron (ZOFRAN) injection 4 mg (4 mg Intravenous Given 11/09/14 1214)  morphine 4 MG/ML injection 4 mg (4 mg Intravenous Given 11/09/14 1214)  sodium chloride 0.9 % bolus 1,000 mL (0 mLs Intravenous Stopped 11/09/14 1254)  morphine 4 MG/ML injection 4 mg (4 mg Intravenous Given 11/09/14 1303)  ondansetron (ZOFRAN) injection 4 mg (4 mg Intravenous Given 11/09/14 1302)  morphine 4 MG/ML injection 4 mg (4 mg Intravenous Given 11/09/14 1436)  sodium chloride 0.9 % bolus 1,000 mL (0 mLs Intravenous Stopped 11/09/14 1622)  iohexol (OMNIPAQUE) 300 MG/ML solution 100 mL (100 mLs Intravenous Contrast Given 11/09/14 1601)  morphine 4 MG/ML injection 4 mg (4 mg Intravenous Given 11/09/14 1632)    MDM   Final diagnoses:  Abdominal pain  Abdominal pain, unspecified abdominal  location    Nursing notes including past medical history and social history reviewed and considered in documentation Labs/vital reviewed myself and considered during evaluation     Joya Gaskinsonald W Destanie Tibbetts, MD 11/09/14 1715

## 2014-11-09 NOTE — ED Notes (Signed)
Pt c/o pain in lower abd rates pain #6 on pain scale 0/10.  Pain med given for same

## 2014-11-09 NOTE — ED Notes (Addendum)
Pt reports onset of lower abd pain this am, was having dry heaving and diarrhea this am, also reports vaginal discharge and abnormal vaginal bleeding last month. Denies urinary symptoms.

## 2014-11-09 NOTE — ED Notes (Signed)
Pt finished contrast and CT called.

## 2014-11-09 NOTE — Discharge Instructions (Signed)

## 2014-11-09 NOTE — ED Notes (Signed)
Pt requesting more pain meds, Dr. Bebe ShaggyWickline made aware

## 2014-11-09 NOTE — ED Notes (Signed)
Pt unable to obtain urine sample at triage. 

## 2014-11-09 NOTE — ED Notes (Signed)
Pelvic cart set up at bedside  

## 2014-11-09 NOTE — ED Notes (Signed)
Patient given Sprite and encouraged to drink.

## 2015-07-29 LAB — OB RESULTS CONSOLE RPR: RPR: NONREACTIVE

## 2015-07-29 LAB — OB RESULTS CONSOLE HEPATITIS B SURFACE ANTIGEN: HEP B S AG: NEGATIVE

## 2015-07-29 LAB — OB RESULTS CONSOLE GC/CHLAMYDIA
Chlamydia: NEGATIVE
Gonorrhea: NEGATIVE

## 2015-07-29 LAB — OB RESULTS CONSOLE ABO/RH: RH TYPE: POSITIVE

## 2015-07-29 LAB — OB RESULTS CONSOLE RUBELLA ANTIBODY, IGM: RUBELLA: IMMUNE

## 2015-07-29 LAB — OB RESULTS CONSOLE HIV ANTIBODY (ROUTINE TESTING): HIV: NONREACTIVE

## 2015-07-29 LAB — OB RESULTS CONSOLE ANTIBODY SCREEN: Antibody Screen: NEGATIVE

## 2015-08-19 ENCOUNTER — Emergency Department (HOSPITAL_COMMUNITY): Payer: BLUE CROSS/BLUE SHIELD

## 2015-08-19 ENCOUNTER — Encounter (HOSPITAL_COMMUNITY): Payer: Self-pay | Admitting: *Deleted

## 2015-08-19 ENCOUNTER — Emergency Department (HOSPITAL_COMMUNITY)
Admission: EM | Admit: 2015-08-19 | Discharge: 2015-08-20 | Disposition: A | Discharge status: Home / Self Care | Payer: BLUE CROSS/BLUE SHIELD | Attending: Emergency Medicine | Admitting: Emergency Medicine

## 2015-08-19 DIAGNOSIS — Z79899 Other long term (current) drug therapy: Secondary | ICD-10-CM | POA: Insufficient documentation

## 2015-08-19 DIAGNOSIS — R1031 Right lower quadrant pain: Secondary | ICD-10-CM | POA: Diagnosis not present

## 2015-08-19 DIAGNOSIS — K37 Unspecified appendicitis: Secondary | ICD-10-CM

## 2015-08-19 DIAGNOSIS — O9989 Other specified diseases and conditions complicating pregnancy, childbirth and the puerperium: Secondary | ICD-10-CM | POA: Insufficient documentation

## 2015-08-19 DIAGNOSIS — Z8742 Personal history of other diseases of the female genital tract: Secondary | ICD-10-CM | POA: Insufficient documentation

## 2015-08-19 DIAGNOSIS — Z3490 Encounter for supervision of normal pregnancy, unspecified, unspecified trimester: Secondary | ICD-10-CM

## 2015-08-19 LAB — URINALYSIS, ROUTINE W REFLEX MICROSCOPIC
Bilirubin Urine: NEGATIVE
GLUCOSE, UA: NEGATIVE mg/dL
Hgb urine dipstick: NEGATIVE
KETONES UR: 15 mg/dL — AB
NITRITE: NEGATIVE
PROTEIN: NEGATIVE mg/dL
Specific Gravity, Urine: 1.02 (ref 1.005–1.030)
Urobilinogen, UA: 0.2 mg/dL (ref 0.0–1.0)
pH: 6 (ref 5.0–8.0)

## 2015-08-19 LAB — URINE MICROSCOPIC-ADD ON

## 2015-08-19 LAB — CBC
HCT: 37 % (ref 36.0–46.0)
Hemoglobin: 12.6 g/dL (ref 12.0–15.0)
MCH: 30.5 pg (ref 26.0–34.0)
MCHC: 34.1 g/dL (ref 30.0–36.0)
MCV: 89.6 fL (ref 78.0–100.0)
PLATELETS: 272 10*3/uL (ref 150–400)
RBC: 4.13 MIL/uL (ref 3.87–5.11)
RDW: 12.4 % (ref 11.5–15.5)
WBC: 9.9 10*3/uL (ref 4.0–10.5)

## 2015-08-19 LAB — COMPREHENSIVE METABOLIC PANEL
ALK PHOS: 40 U/L (ref 38–126)
ALT: 10 U/L — AB (ref 14–54)
AST: 17 U/L (ref 15–41)
Albumin: 3.9 g/dL (ref 3.5–5.0)
Anion gap: 10 (ref 5–15)
BUN: <5 mg/dL — ABNORMAL LOW (ref 6–20)
CALCIUM: 9.8 mg/dL (ref 8.9–10.3)
CO2: 22 mmol/L (ref 22–32)
CREATININE: 0.51 mg/dL (ref 0.44–1.00)
Chloride: 104 mmol/L (ref 101–111)
Glucose, Bld: 89 mg/dL (ref 65–99)
Potassium: 3.5 mmol/L (ref 3.5–5.1)
Sodium: 136 mmol/L (ref 135–145)
TOTAL PROTEIN: 7.2 g/dL (ref 6.5–8.1)
Total Bilirubin: 0.7 mg/dL (ref 0.3–1.2)

## 2015-08-19 LAB — I-STAT BETA HCG BLOOD, ED (MC, WL, AP ONLY): I-stat hCG, quantitative: >2000 m[IU]/mL — ABNORMAL HIGH (ref <5)

## 2015-08-19 LAB — LIPASE, BLOOD: LIPASE: 25 U/L (ref 22–51)

## 2015-08-19 MED ORDER — ONDANSETRON 4 MG PO TBDP
4.0000 mg | ORAL_TABLET | Freq: Once | ORAL | Status: AC | PRN
  Administered 2015-08-19: 4 mg via ORAL

## 2015-08-19 MED ORDER — ONDANSETRON 4 MG PO TBDP
ORAL_TABLET | ORAL | Status: AC
  Filled 2015-08-19: qty 1

## 2015-08-19 NOTE — ED Provider Notes (Addendum)
CSN: [id number]     Arrival date & time 08/19/15  1626 History   First MD Initiated Contact with Patient 08/19/15 2054     Chief Complaint  Patient presents with  . Abdominal Pain     (Consider location/radiation/quality/duration/timing/severity/associated sxs/prior Treatment) HPI Patient reports right lower quadrant pain that has been increasing for 3 days. She states that she has had intermittent vomiting. She reports that she started up about 5 times since it started. She reports initially she thought it might be constipation but she has taken MiraLAX and had normal bowel movements now. She reports she is [redacted] weeks pregnant and she saw her OB doctor today. She reports she will was told it had nothing to do with her endometriosis or her pregnancy. She reports with her endometriosis she often gets left-sided lower abdominal pain but right-sided it is atypical. Past Medical History  Diagnosis Date  . Endometriosis   . Pregnant   . Ovarian cyst   . H/O varicella   . Contact with or exposure to other viral diseases(V01.79)   . Normal labor 08/04/2012  . NSVD (normal spontaneous vaginal delivery, 2nd deg lac, 9/5) 08/05/2012  . Medical history non-contributory    Past Surgical History  Procedure Laterality Date  . Laparoscopic endometriosis fulguration    . Tonsillectomy    . Upper gastrointestinal endoscopy    . Colonoscopy    . Wisdom tooth extraction    . Laparoscopy N/A 03/07/2014    Procedure: LAPAROSCOPY DIAGNOSTIC;  Surgeon: Lenoard Aden, MD;  Location: [facility];  Service: Gynecology;  Laterality: N/A;  . Robotic assisted laparoscopic lysis of adhesion N/A 03/07/2014    Procedure: ROBOTIC ASSISTED LAPAROSCOPIC LYSIS OF ADHESION; Ablation of Endometriosis;  Surgeon: Lenoard Aden, MD;  Location: [facility];  Service: Gynecology;  Laterality: N/A;   Family History  Problem Relation Age of Onset  . Adopted: Yes  . Anesthesia problems Neg Hx    Social History  Substance Use Topics   . Smoking status: Never Smoker   . Smokeless tobacco: Never Used  . Alcohol Use: No   OB History    Gravida Para Term Preterm AB TAB SAB Ectopic Multiple Living   Review of Systems 10 Systems reviewed and are negative for acute change except as noted in the HPI.    Allergies  Review of patient's allergies indicates no known allergies.  Home Medications   Prior to Admission medications   Medication Sig Start Date End Date Taking? Authorizing Provider  Prenatal Vit-Fe Fumarate-FA (PRENATAL PO) Take 1 tablet by mouth daily.   Yes Historical Provider, MD  acetaminophen (TYLENOL) 500 MG tablet Take 2 tablets (1,000 mg total) by mouth every 6 (six) hours as needed. 08/20/15   Arby Barrette, MD   BP 90/49 mmHg  Pulse 70  Temp(Src) 97.9 F (36.6 C) (Oral)  Resp 16  Ht  (1.6 m)  Wt 154 lb 12.8 oz (70.217 kg)  BMI 27.43 kg/m2  SpO2 100%  LMP 05/16/2015 Physical Exam  Constitutional: She is oriented to person, place, and time. She appears well-developed and well-nourished.  HENT:  Head: Normocephalic and atraumatic.  Eyes: EOM are normal. Pupils are equal, round, and reactive to light.  Neck: Neck supple.  Cardiovascular: Normal rate, regular rhythm, normal heart sounds and intact distal pulses.   Pulmonary/Chest: Effort normal and breath sounds normal.  Abdominal: Soft. Bowel sounds are normal.  She exhibits no distension. There is tenderness.  Tenderness very low in the pelvis on the right. No obturator sign. Patient endorses rebound tenderness.  Musculoskeletal: Normal range of motion. She exhibits no edema.  Neurological: She is alert and oriented to person, place, and time. She has normal strength. Coordination normal. GCS eye subscore is 4. GCS verbal subscore is 5. GCS motor subscore is 6.  Skin: Skin is warm, dry and intact.  Psychiatric: She has a normal mood and affect.    ED Course  Procedures (including critical care time) Labs  Review Labs Reviewed  COMPREHENSIVE METABOLIC PANEL - Abnormal; Notable for the following:    BUN <5 (*)    ALT 10 (*)    All other components within normal limits  URINALYSIS, ROUTINE W REFLEX MICROSCOPIC (NOT AT [facility]) - Abnormal; Notable for the following:    APPearance CLOUDY (*)    Ketones, ur 15 (*)    Leukocytes, UA SMALL (*)    All other components within normal limits  URINE MICROSCOPIC-ADD ON - Abnormal; Notable for the following:    Squamous Epithelial / LPF FEW (*)    All other components within normal limits  I-STAT BETA HCG BLOOD, ED ([facility], [facility], [facility] ONLY) - Abnormal; Notable for the following:    I-stat hCG, quantitative >2000.0 (*)    All other components within normal limits  LIPASE, BLOOD  CBC    Imaging Review No results found. I have personally reviewed and evaluated these images and lab results as part of my medical decision-making.   EKG Interpretation None     Consult: Case reviewed with on-call nurse practitioner for Dr. Jorene Minors group. Will get MRI to rule out for appendicitis  MDM   Final diagnoses:  Pregnancy  Right lower quadrant pain   Patient presented with 3 days of worsening right lower quadrant pain. She is [redacted] weeks pregnant. She had seen her OB physician today and by her report was told that her pain was not related to any problems with her pregnancy. Patient continued to complain of pain and at times was tearful. Ultrasound did not identify appendix. It did identify normal. Pregnancy with small subchorionic hemorrhage. After completing initial evaluation patient continued to show signs of distress and endorsed significantly reducible pain in the right lower quadrant. MRI was obtained to definitively rule out for appendicitis. With negative MRI plan will be for the patient follow-up with OB and take Tylenol as needed for pain control. She is well in appearance and does not show other signs of acute infectious etiology.    Arby Barrette,  MD 08/27/15 1610  Arby Barrette, MD 08/27/15 1057

## 2015-08-19 NOTE — ED Notes (Signed)
Pt c/o RLQ abd pain since Friday. With emesis and dizziness. States she is [redacted] weeks pregnant. Has received prenatal care. saw her OB-GYN this morning and stated that it was not related to her pregnancy.

## 2015-08-20 ENCOUNTER — Emergency Department (HOSPITAL_COMMUNITY): Payer: BLUE CROSS/BLUE SHIELD

## 2015-08-20 MED ORDER — ACETAMINOPHEN 500 MG PO TABS
1000.0000 mg | ORAL_TABLET | Freq: Once | ORAL | Status: AC
  Administered 2015-08-20: 1000 mg via ORAL
  Filled 2015-08-20: qty 2

## 2015-08-20 MED ORDER — ACETAMINOPHEN 500 MG PO TABS: 1000.0000 mg | ORAL_TABLET | Freq: Four times a day (QID) | ORAL | Status: DC | PRN

## 2015-08-20 MED ORDER — SODIUM CHLORIDE 0.9 % IV BOLUS (SEPSIS)
1000.0000 mL | Freq: Once | INTRAVENOUS | Status: AC
  Administered 2015-08-20: 1000 mL via INTRAVENOUS

## 2015-08-20 MED ORDER — SODIUM CHLORIDE 0.9 % IV SOLN
INTRAVENOUS | Status: DC
  Administered 2015-08-20: 01:00:00 via INTRAVENOUS

## 2015-08-20 NOTE — ED Notes (Signed)
Pt continues to c/o pain. Admits dizziness. MD notified.

## 2015-08-20 NOTE — Discharge Instructions (Signed)

## 2015-11-29 ENCOUNTER — Encounter (HOSPITAL_COMMUNITY): Payer: Self-pay

## 2015-11-29 ENCOUNTER — Inpatient Hospital Stay (HOSPITAL_COMMUNITY)
Admission: AD | Admit: 2015-11-29 | Discharge: 2015-11-29 | Disposition: A | Discharge status: Home / Self Care | Payer: BLUE CROSS/BLUE SHIELD | Source: Ambulatory Visit | Attending: Obstetrics and Gynecology | Admitting: Obstetrics and Gynecology

## 2015-11-29 DIAGNOSIS — O26892 Other specified pregnancy related conditions, second trimester: Secondary | ICD-10-CM

## 2015-11-29 DIAGNOSIS — R0602 Shortness of breath: Secondary | ICD-10-CM | POA: Diagnosis not present

## 2015-11-29 DIAGNOSIS — N83209 Unspecified ovarian cyst, unspecified side: Secondary | ICD-10-CM | POA: Insufficient documentation

## 2015-11-29 DIAGNOSIS — M549 Dorsalgia, unspecified: Secondary | ICD-10-CM | POA: Insufficient documentation

## 2015-11-29 DIAGNOSIS — Z3A27 27 weeks gestation of pregnancy: Secondary | ICD-10-CM | POA: Insufficient documentation

## 2015-11-29 DIAGNOSIS — M7989 Other specified soft tissue disorders: Secondary | ICD-10-CM | POA: Diagnosis not present

## 2015-11-29 DIAGNOSIS — R519 Headache, unspecified: Secondary | ICD-10-CM

## 2015-11-29 DIAGNOSIS — O99891 Other specified diseases and conditions complicating pregnancy: Secondary | ICD-10-CM

## 2015-11-29 DIAGNOSIS — R51 Headache: Secondary | ICD-10-CM | POA: Insufficient documentation

## 2015-11-29 DIAGNOSIS — Z3689 Encounter for other specified antenatal screening: Secondary | ICD-10-CM

## 2015-11-29 DIAGNOSIS — O9989 Other specified diseases and conditions complicating pregnancy, childbirth and the puerperium: Secondary | ICD-10-CM | POA: Diagnosis present

## 2015-11-29 LAB — COMPREHENSIVE METABOLIC PANEL
ALBUMIN: 3.3 g/dL — AB (ref 3.5–5.0)
ALT: 11 U/L — ABNORMAL LOW (ref 14–54)
ANION GAP: 8 (ref 5–15)
AST: 13 U/L — AB (ref 15–41)
Alkaline Phosphatase: 47 U/L (ref 38–126)
BUN: 6 mg/dL (ref 6–20)
CALCIUM: 9.3 mg/dL (ref 8.9–10.3)
CO2: 24 mmol/L (ref 22–32)
Chloride: 104 mmol/L (ref 101–111)
Creatinine, Ser: 0.38 mg/dL — ABNORMAL LOW (ref 0.44–1.00)
GFR calc Af Amer: >60 mL/min (ref >60)
GFR calc non Af Amer: >60 mL/min (ref >60)
Glucose, Bld: 85 mg/dL (ref 65–99)
POTASSIUM: 3.4 mmol/L — AB (ref 3.5–5.1)
SODIUM: 136 mmol/L (ref 135–145)
TOTAL PROTEIN: 6.5 g/dL (ref 6.5–8.1)
Total Bilirubin: 0.4 mg/dL (ref 0.3–1.2)

## 2015-11-29 LAB — PROTEIN / CREATININE RATIO, URINE: CREATININE, URINE: 40 mg/dL

## 2015-11-29 LAB — URINALYSIS, ROUTINE W REFLEX MICROSCOPIC
Bilirubin Urine: NEGATIVE
GLUCOSE, UA: NEGATIVE mg/dL
HGB URINE DIPSTICK: NEGATIVE
KETONES UR: NEGATIVE mg/dL
Nitrite: NEGATIVE
PROTEIN: NEGATIVE mg/dL
Specific Gravity, Urine: <1.005 — ABNORMAL LOW (ref 1.005–1.030)
pH: 6 (ref 5.0–8.0)

## 2015-11-29 LAB — CBC
HCT: 30.9 % — ABNORMAL LOW (ref 36.0–46.0)
Hemoglobin: 10.4 g/dL — ABNORMAL LOW (ref 12.0–15.0)
MCH: 31.2 pg (ref 26.0–34.0)
MCHC: 33.7 g/dL (ref 30.0–36.0)
MCV: 92.8 fL (ref 78.0–100.0)
Platelets: 272 10*3/uL (ref 150–400)
RBC: 3.33 MIL/uL — ABNORMAL LOW (ref 3.87–5.11)
RDW: 13.4 % (ref 11.5–15.5)
WBC: 9.3 10*3/uL (ref 4.0–10.5)

## 2015-11-29 LAB — URINE MICROSCOPIC-ADD ON: RBC / HPF: NONE SEEN RBC/hpf (ref 0–5)

## 2015-11-29 LAB — URIC ACID: Uric Acid, Serum: 3.9 mg/dL (ref 2.3–6.6)

## 2015-11-29 NOTE — Discharge Instructions (Signed)

## 2015-11-29 NOTE — MAU Note (Signed)
Patient presents with with bilateral feet swelling x 3 days, back pain and a lot of pressure and headaches.

## 2015-11-29 NOTE — MAU Note (Signed)
Patient also reports clear vaginal discharge x 3 days, feels like trickling out.

## 2015-11-29 NOTE — MAU Provider Note (Signed)
History     CSN: [id number]  Arrival date and time: 11/29/15 1244   None     Chief Complaint  Patient presents with  . Headache  . Leg Swelling   HPI Comments: G3P1011 '@27' .2 weeks c/o HA, visual disturbances (floaters), back pain, SOB, and LE swelling x5 days. Good FM. No LOF, VB, or ctx. No urinary sx. Pregnancy complicated by thickened NT and normal genetic screen and workup.   OB History    Gravida Para Term Preterm AB TAB SAB Ectopic Multiple Living   '3 1 1  1  1  1 1      ' Past Medical History  Diagnosis Date  . Endometriosis   . Pregnant   . Ovarian cyst   . H/O varicella   . Contact with or exposure to other viral diseases(V01.79)   . Normal labor 08/04/2012  . NSVD (normal spontaneous vaginal delivery, 2nd deg lac, 9/5) 08/05/2012  . Medical history non-contributory     Past Surgical History  Procedure Laterality Date  . Laparoscopic endometriosis fulguration    . Tonsillectomy    . Upper gastrointestinal endoscopy    . Colonoscopy    . Wisdom tooth extraction    . Laparoscopy N/A 03/07/2014    Procedure: LAPAROSCOPY DIAGNOSTIC;  Surgeon: Lovenia Kim, MD;  Location: [facility];  Service: Gynecology;  Laterality: N/A;  . Robotic assisted laparoscopic lysis of adhesion N/A 03/07/2014    Procedure: ROBOTIC ASSISTED LAPAROSCOPIC LYSIS OF ADHESION; Ablation of Endometriosis;  Surgeon: Lovenia Kim, MD;  Location: [facility];  Service: Gynecology;  Laterality: N/A;    Family History  Problem Relation Age of Onset  . Adopted: Yes  . Anesthesia problems Neg Hx     Social History  Substance Use Topics  . Smoking status: Never Smoker   . Smokeless tobacco: Never Used  . Alcohol Use: No    Allergies: No Known Allergies  Prescriptions prior to admission  Medication Sig Dispense Refill Last Dose  . acetaminophen (TYLENOL) 500 MG tablet Take 2 tablets (1,000 mg total) by mouth every 6 (six) hours as needed. 30 tablet 0   . Prenatal Vit-Fe Fumarate-FA (PRENATAL  PO) Take 1 tablet by mouth daily.   08/19/2015 at Unknown time    Review of Systems  Constitutional: Negative.   Eyes:       Floaters  Respiratory: Positive for shortness of breath.   Cardiovascular: Positive for leg swelling.  Gastrointestinal: Negative.   Genitourinary: Negative.   Skin: Negative.   Neurological: Negative.   Endo/Heme/Allergies: Negative.   Psychiatric/Behavioral: Negative.    Physical Exam   Blood pressure 113/72, pulse 79, temperature 97.9 F (36.6 C), temperature source Oral, resp. rate 16, height '5\' 3"'  (1.6 m), weight 76.658 kg (169 lb), last menstrual period 05/16/2015, unknown if currently breastfeeding. SpO2: 100%  Physical Exam  Constitutional: She is oriented to person, place, and time. She appears well-developed and well-nourished.  HENT:  Head: Normocephalic and atraumatic.  Neck: Normal range of motion. Neck supple.  Cardiovascular: Normal rate and regular rhythm.   Respiratory: Effort normal and breath sounds normal.  GI: Soft.  gravid  Genitourinary:  SVE: closed, uneffaced  Musculoskeletal: Normal range of motion. She exhibits no edema.  Neurological: She is alert and oriented to person, place, and time. She has normal reflexes.  No clonus  Skin: Skin is warm and dry.  Psychiatric: She has a normal mood and affect.  EFM: 140 bpm, mod variability, +accels, no decels  Toco: none  Results for Sherry Carr, Sherry Carr (MRN [id number]) as of 11/29/2015 14:48  Ref. Range 11/29/2015 13:07  Sodium Latest Ref Range: 135-145 mmol/L 136  Potassium Latest Ref Range: 3.5-5.1 mmol/L 3.4 (L)  Chloride Latest Ref Range: 101-111 mmol/L 104  CO2 Latest Ref Range: 22-32 mmol/L 24  BUN Latest Ref Range: 6-20 mg/dL 6  Creatinine Latest Ref Range: 0.44-1.00 mg/dL 0.38 (L)  Calcium Latest Ref Range: 8.9-10.3 mg/dL 9.3  EGFR (Non-African Amer.) Latest Ref Range: >60 mL/min >60  EGFR (African American) Latest Ref Range: >60 mL/min >60  Glucose Latest Ref Range: 65-99  mg/dL 85  Anion gap Latest Ref Range: 5-15  8  Alkaline Phosphatase Latest Ref Range: 38-126 U/L 47  Albumin Latest Ref Range: 3.5-5.0 g/dL 3.3 (L)  Uric Acid, Serum Latest Ref Range: 2.3-6.6 mg/dL 3.9  AST Latest Ref Range: 15-41 U/L 13 (L)  ALT Latest Ref Range: 14-54 U/L 11 (L)  Total Protein Latest Ref Range: 6.5-8.1 g/dL 6.5  Total Bilirubin Latest Ref Range: 0.3-1.2 mg/dL 0.4  WBC Latest Ref Range: 4.0-10.5 K/uL 9.3  RBC Latest Ref Range: 3.87-5.11 MIL/uL 3.33 (L)  Hemoglobin Latest Ref Range: 12.0-15.0 g/dL 10.4 (L)  HCT Latest Ref Range: 36.0-46.0 % 30.9 (L)  MCV Latest Ref Range: 78.0-100.0 fL 92.8  MCH Latest Ref Range: 26.0-34.0 pg 31.2  MCHC Latest Ref Range: 30.0-36.0 g/dL 33.7  RDW Latest Ref Range: 11.5-15.5 % 13.4  Platelets Latest Ref Range: 150-400 K/uL 272   Results for Sherry Carr, Sherry Carr (MRN [id number]) as of 11/29/2015 15:15  Ref. Range 11/29/2015 13:00  Appearance Latest Ref Range: CLEAR  CLEAR  Bacteria, UA Latest Ref Range: NONE SEEN  RARE (A)  Bilirubin Urine Latest Ref Range: NEGATIVE  NEGATIVE  Color, Urine Latest Ref Range: YELLOW  YELLOW  Glucose Latest Ref Range: NEGATIVE mg/dL NEGATIVE  Hgb urine dipstick Latest Ref Range: NEGATIVE  NEGATIVE  Ketones, ur Latest Ref Range: NEGATIVE mg/dL NEGATIVE  Leukocytes, UA Latest Ref Range: NEGATIVE  SMALL (A)  Nitrite Latest Ref Range: NEGATIVE  NEGATIVE  pH Latest Ref Range: 5.0-8.0  6.0  Protein Latest Ref Range: NEGATIVE mg/dL NEGATIVE  RBC / HPF Latest Ref Range: 0-5 RBC/hpf NONE SEEN  Specific Gravity, Urine Latest Ref Range: 1.005-1.030  <1.005 (L)  Squamous Epithelial / LPF Latest Ref Range: NONE SEEN  0-5 (A)  WBC, UA Latest Ref Range: 0-5 WBC/hpf 0-5   MAU Course  Procedures   Assessment and Plan  27.[redacted] weeks gestation Headache-no evidence of PEC Back pain Reactive NST  Discharge home Increase water intake Tylenol, heating pad, warm baths prn back pain Follow-up as scheduled next week at  [facility] Dr. Ronita Hipps updated with A/P, agrees  Julianne Handler, N 11/29/2015, 1:36 PM

## 2015-12-01 NOTE — L&D Delivery Note (Signed)
Delivery Note At 6:12 PM a viable and healthy female was delivered via  (Presentation: ROA ).  APGAR: 8, 9; weight pending .   Placenta status: spontaneous, intact.  Cord:  with the following complications: loose Woodworth x one reduced.  Cord pH: na  Anesthesia:  epidural Episiotomy:  none Lacerations:  second Suture Repair: 2.0 vicryl rapide Est. Blood Loss (mL):  100  Mom to postpartum.  Baby to Couplet care / Skin to Skin.  Ranelle Auker J 02/19/2016, 6:34 PM

## 2016-01-06 ENCOUNTER — Inpatient Hospital Stay (HOSPITAL_COMMUNITY)
Admission: AD | Admit: 2016-01-06 | Discharge: 2016-01-06 | Disposition: A | Discharge status: Home / Self Care | Payer: BLUE CROSS/BLUE SHIELD | Source: Ambulatory Visit | Attending: Obstetrics and Gynecology | Admitting: Obstetrics and Gynecology

## 2016-01-06 ENCOUNTER — Encounter (HOSPITAL_COMMUNITY): Payer: Self-pay | Admitting: *Deleted

## 2016-01-06 DIAGNOSIS — R51 Headache: Secondary | ICD-10-CM | POA: Diagnosis not present

## 2016-01-06 DIAGNOSIS — R42 Dizziness and giddiness: Secondary | ICD-10-CM | POA: Diagnosis not present

## 2016-01-06 DIAGNOSIS — Z3A32 32 weeks gestation of pregnancy: Secondary | ICD-10-CM | POA: Insufficient documentation

## 2016-01-06 DIAGNOSIS — R110 Nausea: Secondary | ICD-10-CM | POA: Insufficient documentation

## 2016-01-06 DIAGNOSIS — O4703 False labor before 37 completed weeks of gestation, third trimester: Secondary | ICD-10-CM

## 2016-01-06 LAB — URINALYSIS, ROUTINE W REFLEX MICROSCOPIC
Bilirubin Urine: NEGATIVE
GLUCOSE, UA: NEGATIVE mg/dL
HGB URINE DIPSTICK: NEGATIVE
Ketones, ur: NEGATIVE mg/dL
LEUKOCYTES UA: NEGATIVE
Nitrite: NEGATIVE
PH: 6.5 (ref 5.0–8.0)
Protein, ur: NEGATIVE mg/dL
Specific Gravity, Urine: <1.005 — ABNORMAL LOW (ref 1.005–1.030)

## 2016-01-06 LAB — CBC
HEMATOCRIT: 29.8 % — AB (ref 36.0–46.0)
Hemoglobin: 10 g/dL — ABNORMAL LOW (ref 12.0–15.0)
MCH: 30.7 pg (ref 26.0–34.0)
MCHC: 33.6 g/dL (ref 30.0–36.0)
MCV: 91.4 fL (ref 78.0–100.0)
PLATELETS: 342 10*3/uL (ref 150–400)
RBC: 3.26 MIL/uL — AB (ref 3.87–5.11)
RDW: 13.3 % (ref 11.5–15.5)
WBC: 10.1 10*3/uL (ref 4.0–10.5)

## 2016-01-06 LAB — COMPREHENSIVE METABOLIC PANEL
ALT: 11 U/L — ABNORMAL LOW (ref 14–54)
AST: 19 U/L (ref 15–41)
Albumin: 3.3 g/dL — ABNORMAL LOW (ref 3.5–5.0)
Alkaline Phosphatase: 65 U/L (ref 38–126)
Anion gap: 10 (ref 5–15)
BUN: 5 mg/dL — ABNORMAL LOW (ref 6–20)
CHLORIDE: 105 mmol/L (ref 101–111)
CO2: 23 mmol/L (ref 22–32)
Calcium: 8.8 mg/dL — ABNORMAL LOW (ref 8.9–10.3)
Creatinine, Ser: 0.46 mg/dL (ref 0.44–1.00)
GFR calc non Af Amer: >60 mL/min (ref >60)
Glucose, Bld: 105 mg/dL — ABNORMAL HIGH (ref 65–99)
Potassium: 3.6 mmol/L (ref 3.5–5.1)
SODIUM: 138 mmol/L (ref 135–145)
Total Bilirubin: 0.4 mg/dL (ref 0.3–1.2)
Total Protein: 6.7 g/dL (ref 6.5–8.1)

## 2016-01-06 LAB — PROTEIN / CREATININE RATIO, URINE: Creatinine, Urine: 27 mg/dL

## 2016-01-06 LAB — URIC ACID: URIC ACID, SERUM: 4.3 mg/dL (ref 2.3–6.6)

## 2016-01-06 MED ORDER — OXYCODONE-ACETAMINOPHEN 5-325 MG PO TABS: 1.0000 | ORAL_TABLET | Freq: Four times a day (QID) | ORAL | Status: DC | PRN

## 2016-01-06 MED ORDER — ONDANSETRON 4 MG PO TBDP
4.0000 mg | ORAL_TABLET | Freq: Once | ORAL | Status: AC
Start: 2016-01-06 — End: 2016-01-06
  Administered 2016-01-06: 4 mg via ORAL
  Filled 2016-01-06: qty 1

## 2016-01-06 MED ORDER — TERBUTALINE SULFATE 1 MG/ML IJ SOLN
0.2500 mg | Freq: Once | INTRAMUSCULAR | Status: AC
  Administered 2016-01-06: 0.25 mg via SUBCUTANEOUS
  Filled 2016-01-06: qty 1

## 2016-01-06 NOTE — Discharge Instructions (Signed)

## 2016-01-06 NOTE — MAU Note (Signed)
Pt c/o contractions that started yesterday afternoon. Has tried walking, warm bath and drinking water. Having some nausea, headache and dizziness, that started yesterday as well. +FM. Denies vag bleeding but some clear vaginal discharge.

## 2016-01-06 NOTE — MAU Provider Note (Signed)
History     CSN: [id number]  Arrival date and time: 01/06/16 2016  Orders placed in EPIC: 2034 Provider notified: 2130 Provider on unit: 2135 Provider at bedside: 2140    Chief Complaint  Patient presents with  . Contractions  . Morning Sickness  . Headache   HPI  [name] is a [id number] female at 32.[redacted] wks gestation by ultrasound, presenting with complaints of contractions all day since Sunday afternoon, back pain, legs swelling, "really bad H/A", nausea, palpitations.  She hasn't taken anything for her H/A, because "she was told to take it every 6 hrs last week and it never touched the H/A". So she decided not to take anything today. She reports the contractions are coming more and more today. Denies VB or LOF. She reports (+) FM. Her prenatal care has been complicated by thickened nT with subsequent normal genetic screening and w/u, nremarkable for preterm labor; this is a new occurence. Her primary [facility] provider is Dr. Billy Carr.  Past Medical History  Diagnosis Date  . Endometriosis   . Pregnant   . Ovarian cyst   . H/O varicella   . Contact with or exposure to other viral diseases(V01.79)   . Normal labor 08/04/2012  . NSVD (normal spontaneous vaginal delivery, 2nd deg lac, 9/5) 08/05/2012  . Medical history non-contributory     Past Surgical History  Procedure Laterality Date  . Laparoscopic endometriosis fulguration    . Tonsillectomy    . Upper gastrointestinal endoscopy    . Colonoscopy    . Wisdom tooth extraction    . Laparoscopy N/A 03/07/2014    Procedure: LAPAROSCOPY DIAGNOSTIC;  Surgeon: Lenoard Aden, MD;  Location: [facility];  Service: Gynecology;  Laterality: N/A;  . Robotic assisted laparoscopic lysis of adhesion N/A 03/07/2014    Procedure: ROBOTIC ASSISTED LAPAROSCOPIC LYSIS OF ADHESION; Ablation of Endometriosis;  Surgeon: Lenoard Aden, MD;  Location: [facility];  Service: Gynecology;  Laterality: N/A;    Family History  Problem Relation Age of Onset  .  Adopted: Yes  . Anesthesia problems Neg Hx     Social History  Substance Use Topics  . Smoking status: Never Smoker   . Smokeless tobacco: Never Used  . Alcohol Use: No    Allergies: No Known Allergies  Prescriptions prior to admission  Medication Sig Dispense Refill Last Dose  . calcium carbonate (TUMS - DOSED IN MG ELEMENTAL CALCIUM) 500 MG chewable tablet Chew 2 tablets by mouth 3 (three) times daily as needed for indigestion or heartburn.   01/06/2016 at Unknown time  . Prenatal Vit-Fe Fumarate-FA (PRENATAL MULTIVITAMIN) TABS tablet Take 1 tablet by mouth daily at 12 noon.   01/05/2016 at Unknown time  . acetaminophen (TYLENOL) 500 MG tablet Take 2 tablets (1,000 mg total) by mouth every 6 (six) hours as needed. (Patient not taking: Reported on 01/06/2016) 30 tablet 0 Not Taking at Unknown time    Review of Systems  Constitutional: Negative.   HENT:       Very mild now  Eyes: Negative.   Respiratory: Negative.   Cardiovascular: Negative.   Gastrointestinal: Positive for nausea. Negative for vomiting.  Genitourinary:       Frequent UC's; lots of FM  Musculoskeletal: Negative.   Neurological: Positive for dizziness.       Mild  Endo/Heme/Allergies: Negative.   Psychiatric/Behavioral: Negative.     Results for orders placed or performed during the hospital encounter of 01/06/16 (from the past 24 hour(s))  Protein /  creatinine ratio, urine     Status: None   Collection Time: 01/06/16  8:32 PM  Result Value Ref Range   Creatinine, Urine 27.00 mg/dL   Total Protein, Urine <6 mg/dL   Protein Creatinine Ratio        0.00 - 0.15 mg/mg[Cre]  Urinalysis, Routine w reflex microscopic (not at [facility])     Status: Abnormal   Collection Time: 01/06/16  8:32 PM  Result Value Ref Range   Color, Urine YELLOW YELLOW   APPearance CLEAR CLEAR   Specific Gravity, Urine <1.005 (L) 1.005 - 1.030   pH 6.5 5.0 - 8.0   Glucose, UA NEGATIVE NEGATIVE mg/dL   Hgb urine dipstick NEGATIVE NEGATIVE    Bilirubin Urine NEGATIVE NEGATIVE   Ketones, ur NEGATIVE NEGATIVE mg/dL   Protein, ur NEGATIVE NEGATIVE mg/dL   Nitrite NEGATIVE NEGATIVE   Leukocytes, UA NEGATIVE NEGATIVE  Comprehensive metabolic panel     Status: Abnormal   Collection Time: 01/06/16  8:53 PM  Result Value Ref Range   Sodium 138 135 - 145 mmol/L   Potassium 3.6 3.5 - 5.1 mmol/L   Chloride 105 101 - 111 mmol/L   CO2 23 22 - 32 mmol/L   Glucose, Bld 105 (H) 65 - 99 mg/dL   BUN 5 (L) 6 - 20 mg/dL   Creatinine, Ser 9.52 0.44 - 1.00 mg/dL   Calcium 8.8 (L) 8.9 - 10.3 mg/dL   Total Protein 6.7 6.5 - 8.1 g/dL   Albumin 3.3 (L) 3.5 - 5.0 g/dL   AST 19 15 - 41 U/L   ALT 11 (L) 14 - 54 U/L   Alkaline Phosphatase 65 38 - 126 U/L   Total Bilirubin 0.4 0.3 - 1.2 mg/dL   GFR calc non Af Amer >60 >60 mL/min   GFR calc Af Amer >60 >60 mL/min   Anion gap 10 5 - 15  Uric acid     Status: None   Collection Time: 01/06/16  8:53 PM  Result Value Ref Range   Uric Acid, Serum 4.3 2.3 - 6.6 mg/dL  CBC     Status: Abnormal   Collection Time: 01/06/16  8:53 PM  Result Value Ref Range   WBC 10.1 4.0 - 10.5 K/uL   RBC 3.26 (L) 3.87 - 5.11 MIL/uL   Hemoglobin 10.0 (L) 12.0 - 15.0 g/dL   HCT 84.1 (L) 32.4 - 40.1 %   MCV 91.4 78.0 - 100.0 fL   MCH 30.7 26.0 - 34.0 pg   MCHC 33.6 30.0 - 36.0 g/dL   RDW 02.7 25.3 - 66.4 %   Platelets 342 150 - 400 K/uL    CEFM  FHR: 130 bpm / moderate variability / accels present / decels absent TOCO: regular every 3 mins - RESOLVED after 1 dose of Terbutaline Physical Exam   Blood pressure 110/67, pulse 92, temperature 98.1 F (36.7 C), temperature source Oral, resp. rate 20, height 5' 3.5" (1.613 m), weight 79.742 kg (175 lb 12.8 oz), last menstrual period 05/16/2015, SpO2 100 %.  Physical Exam  Constitutional: She is oriented to person, place, and time. She appears well-developed and well-nourished.  Cardiovascular: Normal rate, regular rhythm, normal heart sounds and intact distal pulses.    Respiratory: Effort normal and breath sounds normal.  GI: Soft. Bowel sounds are normal.  Genitourinary:  Gravis; S=D; VE: closed/thick/soft/high; no bleeding or abnormal vaginal d/c noted  Musculoskeletal: Normal range of motion.  Neurological: She is alert and oriented to person, place, and  time.  Skin: Skin is warm and dry.  Psychiatric: She has a normal mood and affect. Her behavior is normal. Judgment and thought content normal.    MAU Course  Procedures CCUA CBC CMP Uric Acid Protein/Creatinine Ratio CEFM VE  Terbutaline  PO hydration Narcotic medication not offered - pt drove self to hospital / declined Tylenol Assessment and Plan  30 yo G3P1011 at 32.[redacted] wks gestation Preterm Contractions Nausea Headache vs PIH - stable BPs and PIH labs Dizziness Category 1 FHR tracing  Discharge home PTL precautions reviewed Rx: Percocet 5/325 1 tab po every 6 hrs prn pain x 3 days Disp # 12, NR Follow-up with Dr. Billy Carr in 2 days Call the office for any further questions, problems or concerns  *Dr. Billy Carr notified of assessment and plan - agrees   Kenard Gower [name], CNM 01/06/2016, 9:40 PM

## 2016-01-29 LAB — OB RESULTS CONSOLE GBS: GBS: POSITIVE

## 2016-02-08 ENCOUNTER — Inpatient Hospital Stay (HOSPITAL_COMMUNITY)
Admission: AD | Admit: 2016-02-08 | Discharge: 2016-02-08 | Disposition: A | Discharge status: Home / Self Care | Payer: BLUE CROSS/BLUE SHIELD | Source: Ambulatory Visit | Attending: Obstetrics and Gynecology | Admitting: Obstetrics and Gynecology

## 2016-02-08 ENCOUNTER — Encounter (HOSPITAL_COMMUNITY): Payer: Self-pay | Admitting: *Deleted

## 2016-02-08 DIAGNOSIS — Z3493 Encounter for supervision of normal pregnancy, unspecified, third trimester: Secondary | ICD-10-CM | POA: Diagnosis not present

## 2016-02-08 NOTE — Progress Notes (Signed)
Written and verbal d/c instructions given and understanding voiced. 

## 2016-02-08 NOTE — Progress Notes (Signed)
Dr Billy Coastaavon made aware of pt's concern regarding getting back in time for antibiotic tx for GBS when in labor. Also made aware of hx of leaking fld on occ and panties sometimes wet. Pt had normal AFI this past TUes. OK for d/c home

## 2016-02-08 NOTE — MAU Note (Signed)
Pt states leaked fld about a week ago and told doctor but was told was ok and probably urine.States sometimes her underwear is wet. Had u/s Tues and told fluid was ok

## 2016-02-08 NOTE — Discharge Instructions (Signed)
Braxton Hicks Contractions °Contractions of the uterus can occur throughout pregnancy. Contractions are not always a sign that you are in labor.  °WHAT ARE BRAXTON HICKS CONTRACTIONS?  °Contractions that occur before labor are called Braxton Hicks contractions, or false labor. Toward the end of pregnancy (32-34 weeks), these contractions can develop more often and may become more forceful. This is not true labor because these contractions do not result in opening (dilatation) and thinning of the cervix. They are sometimes difficult to tell apart from true labor because these contractions can be forceful and people have different pain tolerances. You should not feel embarrassed if you go to the hospital with false labor. Sometimes, the only way to tell if you are in true labor is for your health care provider to look for changes in the cervix. °If there are no prenatal problems or other health problems associated with the pregnancy, it is completely safe to be sent home with false labor and await the onset of true labor. °HOW CAN YOU TELL THE DIFFERENCE BETWEEN TRUE AND FALSE LABOR? °False Labor °· The contractions of false labor are usually shorter and not as hard as those of true labor.   °· The contractions are usually irregular.   °· The contractions are often felt in the front of the lower abdomen and in the groin.   °· The contractions may go away when you walk around or change positions while lying down.   °· The contractions get weaker and are shorter lasting as time goes on.   °· The contractions do not usually become progressively stronger, regular, and closer together as with true labor.   °True Labor °· Contractions in true labor last 30-70 seconds, become very regular, usually become more intense, and increase in frequency.   °· The contractions do not go away with walking.   °· The discomfort is usually felt in the top of the uterus and spreads to the lower abdomen and low back.   °· True labor can be  determined by your health care provider with an exam. This will show that the cervix is dilating and getting thinner.   °WHAT TO REMEMBER °· Keep up with your usual exercises and follow other instructions given by your health care provider.   °· Take medicines as directed by your health care provider.   °· Keep your regular prenatal appointments.   °· Eat and drink lightly if you think you are going into labor.   °· If Braxton Hicks contractions are making you uncomfortable:   °¨ Change your position from lying down or resting to walking, or from walking to resting.   °¨ Sit and rest in a tub of warm water.   °¨ Drink 2-3 glasses of water. Dehydration may cause these contractions.   °¨ Do slow and deep breathing several times an hour.   °WHEN SHOULD I SEEK IMMEDIATE MEDICAL CARE? °Seek immediate medical care if: °· Your contractions become stronger, more regular, and closer together.   °· You have fluid leaking or gushing from your vagina.   °· You have a fever.   °· You pass blood-tinged mucus.   °· You have vaginal bleeding.   °· You have continuous abdominal pain.   °· You have low back pain that you never had before.   °· You feel your baby's head pushing down and causing pelvic pressure.   °· Your baby is not moving as much as it used to.   °  °This information is not intended to replace advice given to you by your health care provider. Make sure you discuss any questions you have with your health care   provider. °  °Document Released: 11/16/2005 Document Revised: 11/21/2013 Document Reviewed: 08/28/2013 °Elsevier Interactive Patient Education ©2016 [employer] ° °

## 2016-02-08 NOTE — Progress Notes (Signed)
Dr Billy Coastaavon notified of pt's admission and status. Aware of ctx patttern, sve, reactive FHR. Pt stable for d/c home.

## 2016-02-08 NOTE — MAU Note (Signed)
Contractions since 2200. Leaking some fld but unsure if is urine or not.

## 2016-02-11 ENCOUNTER — Other Ambulatory Visit: Payer: Self-pay | Admitting: Obstetrics and Gynecology

## 2016-02-14 ENCOUNTER — Telehealth (HOSPITAL_COMMUNITY): Payer: Self-pay | Admitting: *Deleted

## 2016-02-14 ENCOUNTER — Encounter (HOSPITAL_COMMUNITY): Payer: Self-pay | Admitting: *Deleted

## 2016-02-14 NOTE — Telephone Encounter (Signed)
Preadmission screen  

## 2016-02-18 ENCOUNTER — Other Ambulatory Visit: Payer: Self-pay | Admitting: Obstetrics and Gynecology

## 2016-02-18 NOTE — H&P (Signed)
Sherry Carr is a 30 y.o. female presenting for presumed LGA with favorable cervix. Maternal Medical History:  Reason for admission: Contractions.   Contractions: Onset was less than 1 hour ago.   Frequency: rare.   Perceived severity is mild.    Fetal activity: Perceived fetal activity is normal.   Last perceived fetal movement was within the past hour.    Prenatal complications: Polyhydramnios and preterm labor.   Prenatal Complications - Diabetes: none.    OB History    Gravida Para Term Preterm AB TAB SAB Ectopic Multiple Living   0 1     Past Medical History  Diagnosis Date  . Endometriosis   . Pregnant   . Ovarian cyst   . H/O varicella   . Contact with or exposure to other viral diseases(V01.79)   . Normal labor 08/04/2012  . NSVD (normal spontaneous vaginal delivery, 2nd deg lac, 9/5) 08/05/2012  . Medical history non-contributory    Past Surgical History  Procedure Laterality Date  . Laparoscopic endometriosis fulguration    . Tonsillectomy    . Upper gastrointestinal endoscopy    . Colonoscopy    . Wisdom tooth extraction    . Laparoscopy N/A 03/07/2014    Procedure: LAPAROSCOPY DIAGNOSTIC;  Surgeon: Lenoard Aden, MD;  Location: [facility];  Service: Gynecology;  Laterality: N/A;  . Robotic assisted laparoscopic lysis of adhesion N/A 03/07/2014    Procedure: ROBOTIC ASSISTED LAPAROSCOPIC LYSIS OF ADHESION; Ablation of Endometriosis;  Surgeon: Lenoard Aden, MD;  Location: [facility];  Service: Gynecology;  Laterality: N/A;   Family History: family history is negative for Anesthesia problems. She was adopted. Social History:  reports that she has never smoked. She has never used smokeless tobacco. She reports that she does not drink alcohol or use illicit drugs.   Prenatal Transfer Tool  Maternal Diabetes: No Genetic Screening: Normal Maternal Ultrasounds/Referrals: Normal Fetal Ultrasounds or other Referrals:  None Maternal Substance Abuse:   No Significant Maternal Medications:  None Significant Maternal Lab Results:  None Other Comments:  None  Review of Systems  Constitutional: Negative.   All other systems reviewed and are negative.     Last menstrual period 05/16/2015, unknown if currently breastfeeding. Maternal Exam:  Uterine Assessment: Contraction strength is mild.  Contraction frequency is rare.   Abdomen: Patient reports no abdominal tenderness. Fetal presentation: vertex  Introitus: Normal vulva. Normal vagina.  Ferning test: not done.  Nitrazine test: not done. Amniotic fluid character: not assessed.  Pelvis: adequate for delivery.   Cervix: Cervix evaluated by digital exam.     Physical Exam  Nursing note and vitals reviewed. Constitutional: She is oriented to person, place, and time. She appears well-developed and well-nourished.  HENT:  Head: Normocephalic.  Left Ear: External ear normal.  Cardiovascular: Normal rate and regular rhythm.   Respiratory: Effort normal and breath sounds normal.  GI: Soft. Bowel sounds are normal.  Genitourinary: Vagina normal and uterus normal.  Musculoskeletal: Normal range of motion.  Neurological: She is alert and oriented to person, place, and time. She has normal reflexes.  Skin: Skin is warm and dry.  Psychiatric: She has a normal mood and affect.    Prenatal labs: ABO, Rh: A/Positive/-- (08/29 0000) Antibody: Negative (08/29 0000) Rubella: Immune (08/29 0000) RPR: Nonreactive (08/29 0000)  HBsAg: Negative (08/29 0000)  HIV: Non-reactive (08/29 0000)  GBS: Positive (03/01 0000)   Assessment/Plan: 39 week iup Presumed LGA with favorable  cervix IOL   Lynora Dymond J 02/18/2016, 1:45 PM

## 2016-02-19 ENCOUNTER — Inpatient Hospital Stay (HOSPITAL_COMMUNITY): Payer: BLUE CROSS/BLUE SHIELD | Admitting: Anesthesiology

## 2016-02-19 ENCOUNTER — Inpatient Hospital Stay (HOSPITAL_COMMUNITY)
Admission: RE | Admit: 2016-02-19 | Discharge: 2016-02-21 | DRG: 775 | Disposition: A | Discharge status: Home / Self Care | Payer: BLUE CROSS/BLUE SHIELD | Source: Ambulatory Visit | Attending: Obstetrics and Gynecology | Admitting: Obstetrics and Gynecology

## 2016-02-19 ENCOUNTER — Encounter (HOSPITAL_COMMUNITY): Payer: Self-pay

## 2016-02-19 DIAGNOSIS — IMO0001 Reserved for inherently not codable concepts without codable children: Secondary | ICD-10-CM | POA: Diagnosis present

## 2016-02-19 DIAGNOSIS — O3663X0 Maternal care for excessive fetal growth, third trimester, not applicable or unspecified: Principal | ICD-10-CM | POA: Diagnosis present

## 2016-02-19 DIAGNOSIS — IMO0002 Reserved for concepts with insufficient information to code with codable children: Secondary | ICD-10-CM | POA: Diagnosis present

## 2016-02-19 DIAGNOSIS — Z3A39 39 weeks gestation of pregnancy: Secondary | ICD-10-CM

## 2016-02-19 DIAGNOSIS — D62 Acute posthemorrhagic anemia: Secondary | ICD-10-CM | POA: Diagnosis not present

## 2016-02-19 DIAGNOSIS — O9081 Anemia of the puerperium: Secondary | ICD-10-CM | POA: Diagnosis not present

## 2016-02-19 LAB — CBC
HCT: 29 % — ABNORMAL LOW (ref 36.0–46.0)
Hemoglobin: 9.7 g/dL — ABNORMAL LOW (ref 12.0–15.0)
MCH: 29 pg (ref 26.0–34.0)
MCHC: 33.4 g/dL (ref 30.0–36.0)
MCV: 86.6 fL (ref 78.0–100.0)
PLATELETS: 310 10*3/uL (ref 150–400)
RBC: 3.35 MIL/uL — AB (ref 3.87–5.11)
RDW: 13.8 % (ref 11.5–15.5)
WBC: 9.8 10*3/uL (ref 4.0–10.5)

## 2016-02-19 LAB — TYPE AND SCREEN
ABO/RH(D): A POS
Antibody Screen: NEGATIVE

## 2016-02-19 LAB — ABO/RH: ABO/RH(D): A POS

## 2016-02-19 MED ORDER — ZOLPIDEM TARTRATE 5 MG PO TABS: 5.0000 mg | ORAL_TABLET | Freq: Every evening | ORAL | Status: DC | PRN

## 2016-02-19 MED ORDER — TETANUS-DIPHTH-ACELL PERTUSSIS 5-2.5-18.5 LF-MCG/0.5 IM SUSP: 0.5000 mL | Freq: Once | INTRAMUSCULAR | Status: DC

## 2016-02-19 MED ORDER — LACTATED RINGERS IV SOLN: 500.0000 mL | INTRAVENOUS | Status: DC | PRN

## 2016-02-19 MED ORDER — OXYTOCIN 10 UNIT/ML IJ SOLN
2.5000 [IU]/h | INTRAVENOUS | Status: DC
  Filled 2016-02-19: qty 10

## 2016-02-19 MED ORDER — LACTATED RINGERS IV SOLN: 500.0000 mL | Freq: Once | INTRAVENOUS | Status: DC

## 2016-02-19 MED ORDER — SENNOSIDES-DOCUSATE SODIUM 8.6-50 MG PO TABS
2.0000 | ORAL_TABLET | ORAL | Status: DC
  Administered 2016-02-19 – 2016-02-21 (×2): 2 via ORAL
  Filled 2016-02-19 (×2): qty 2

## 2016-02-19 MED ORDER — EPHEDRINE 5 MG/ML INJ
10.0000 mg | INTRAVENOUS | Status: DC | PRN
  Filled 2016-02-19: qty 2

## 2016-02-19 MED ORDER — OXYCODONE-ACETAMINOPHEN 5-325 MG PO TABS: 2.0000 | ORAL_TABLET | ORAL | Status: DC | PRN

## 2016-02-19 MED ORDER — DIBUCAINE 1 % RE OINT: 1.0000 "application " | TOPICAL_OINTMENT | RECTAL | Status: DC | PRN

## 2016-02-19 MED ORDER — LIDOCAINE HCL (PF) 1 % IJ SOLN
INTRAMUSCULAR | Status: DC | PRN
  Administered 2016-02-19 (×2): 7 mL via EPIDURAL

## 2016-02-19 MED ORDER — PRENATAL MULTIVITAMIN CH
1.0000 | ORAL_TABLET | Freq: Every day | ORAL | Status: DC
  Administered 2016-02-20: 1 via ORAL
  Filled 2016-02-19: qty 1

## 2016-02-19 MED ORDER — BENZOCAINE-MENTHOL 20-0.5 % EX AERO
1.0000 "application " | INHALATION_SPRAY | CUTANEOUS | Status: DC | PRN
  Administered 2016-02-19: 1 via TOPICAL
  Filled 2016-02-19: qty 56

## 2016-02-19 MED ORDER — OXYTOCIN 10 UNIT/ML IJ SOLN
1.0000 m[IU]/min | INTRAMUSCULAR | Status: DC
  Administered 2016-02-19: 2 m[IU]/min via INTRAVENOUS

## 2016-02-19 MED ORDER — ONDANSETRON HCL 4 MG/2ML IJ SOLN: 4.0000 mg | INTRAMUSCULAR | Status: DC | PRN

## 2016-02-19 MED ORDER — CITRIC ACID-SODIUM CITRATE 334-500 MG/5ML PO SOLN: 30.0000 mL | ORAL | Status: DC | PRN

## 2016-02-19 MED ORDER — OXYTOCIN BOLUS FROM INFUSION: 500.0000 mL | INTRAVENOUS | Status: DC

## 2016-02-19 MED ORDER — TERBUTALINE SULFATE 1 MG/ML IJ SOLN
0.2500 mg | Freq: Once | INTRAMUSCULAR | Status: DC | PRN
  Filled 2016-02-19: qty 1

## 2016-02-19 MED ORDER — SIMETHICONE 80 MG PO CHEW: 80.0000 mg | CHEWABLE_TABLET | ORAL | Status: DC | PRN

## 2016-02-19 MED ORDER — LANOLIN HYDROUS EX OINT: TOPICAL_OINTMENT | CUTANEOUS | Status: DC | PRN

## 2016-02-19 MED ORDER — DIPHENHYDRAMINE HCL 25 MG PO CAPS: 25.0000 mg | ORAL_CAPSULE | Freq: Four times a day (QID) | ORAL | Status: DC | PRN

## 2016-02-19 MED ORDER — METHYLERGONOVINE MALEATE 0.2 MG/ML IJ SOLN: 0.2000 mg | INTRAMUSCULAR | Status: DC | PRN

## 2016-02-19 MED ORDER — FLEET ENEMA 7-19 GM/118ML RE ENEM: 1.0000 | ENEMA | RECTAL | Status: DC | PRN

## 2016-02-19 MED ORDER — ONDANSETRON HCL 4 MG/2ML IJ SOLN: 4.0000 mg | Freq: Four times a day (QID) | INTRAMUSCULAR | Status: DC | PRN

## 2016-02-19 MED ORDER — ACETAMINOPHEN 325 MG PO TABS
650.0000 mg | ORAL_TABLET | ORAL | Status: DC | PRN
  Administered 2016-02-20 (×2): 650 mg via ORAL
  Filled 2016-02-19 (×2): qty 2

## 2016-02-19 MED ORDER — PHENYLEPHRINE 40 MCG/ML (10ML) SYRINGE FOR IV PUSH (FOR BLOOD PRESSURE SUPPORT)
80.0000 ug | PREFILLED_SYRINGE | INTRAVENOUS | Status: DC | PRN
  Filled 2016-02-19: qty 2
  Filled 2016-02-19: qty 20

## 2016-02-19 MED ORDER — LIDOCAINE HCL (PF) 1 % IJ SOLN
30.0000 mL | INTRAMUSCULAR | Status: DC | PRN
  Filled 2016-02-19: qty 30

## 2016-02-19 MED ORDER — WITCH HAZEL-GLYCERIN EX PADS
1.0000 "application " | MEDICATED_PAD | CUTANEOUS | Status: DC | PRN
  Administered 2016-02-21: 1 via TOPICAL

## 2016-02-19 MED ORDER — LACTATED RINGERS IV SOLN
INTRAVENOUS | Status: DC
  Administered 2016-02-19 (×2): via INTRAVENOUS
  Administered 2016-02-19: 500 mL/h via INTRAVENOUS

## 2016-02-19 MED ORDER — DEXTROSE 5 % IV SOLN
2.5000 10*6.[IU] | INTRAVENOUS | Status: DC
  Administered 2016-02-19: 2.5 10*6.[IU] via INTRAVENOUS
  Filled 2016-02-19 (×3): qty 2.5

## 2016-02-19 MED ORDER — DEXTROSE 5 % IV SOLN
5.0000 10*6.[IU] | Freq: Once | INTRAVENOUS | Status: AC
  Administered 2016-02-19: 5 10*6.[IU] via INTRAVENOUS
  Filled 2016-02-19: qty 5

## 2016-02-19 MED ORDER — OXYCODONE-ACETAMINOPHEN 5-325 MG PO TABS: 1.0000 | ORAL_TABLET | ORAL | Status: DC | PRN

## 2016-02-19 MED ORDER — PHENYLEPHRINE 40 MCG/ML (10ML) SYRINGE FOR IV PUSH (FOR BLOOD PRESSURE SUPPORT)
80.0000 ug | PREFILLED_SYRINGE | INTRAVENOUS | Status: DC | PRN
Start: 2016-02-19 — End: 2016-02-19
  Filled 2016-02-19: qty 2

## 2016-02-19 MED ORDER — DIPHENHYDRAMINE HCL 50 MG/ML IJ SOLN: 12.5000 mg | INTRAMUSCULAR | Status: DC | PRN

## 2016-02-19 MED ORDER — FENTANYL CITRATE (PF) 100 MCG/2ML IJ SOLN: 50.0000 ug | INTRAMUSCULAR | Status: DC | PRN

## 2016-02-19 MED ORDER — ONDANSETRON HCL 4 MG PO TABS: 4.0000 mg | ORAL_TABLET | ORAL | Status: DC | PRN

## 2016-02-19 MED ORDER — METHYLERGONOVINE MALEATE 0.2 MG PO TABS: 0.2000 mg | ORAL_TABLET | ORAL | Status: DC | PRN

## 2016-02-19 MED ORDER — ACETAMINOPHEN 325 MG PO TABS: 650.0000 mg | ORAL_TABLET | ORAL | Status: DC | PRN

## 2016-02-19 MED ORDER — FENTANYL 2.5 MCG/ML BUPIVACAINE 1/10 % EPIDURAL INFUSION (WH - ANES)
14.0000 mL/h | INTRAMUSCULAR | Status: DC | PRN
  Administered 2016-02-19: 14 mL/h via EPIDURAL
  Filled 2016-02-19: qty 125

## 2016-02-19 MED ORDER — EPHEDRINE 5 MG/ML INJ
10.0000 mg | INTRAVENOUS | Status: DC | PRN
Start: 2016-02-19 — End: 2016-02-19
  Filled 2016-02-19: qty 2

## 2016-02-19 MED ORDER — IBUPROFEN 600 MG PO TABS
600.0000 mg | ORAL_TABLET | Freq: Four times a day (QID) | ORAL | Status: DC
  Administered 2016-02-19 – 2016-02-21 (×7): 600 mg via ORAL
  Filled 2016-02-19 (×7): qty 1

## 2016-02-19 NOTE — Lactation Note (Signed)
This note was copied from a baby's chart. Lactation Consultation Note  Patient Name: Sherry Carr [id number]'XToday's Date: 02/19/2016 Reason for consult: Initial assessment Baby at 4 hr of life and mom reports he is latching well. She bf her older child 7112 m with no issues. She has DEBP at home but has never used one. She denies breast or nipple pain, no concerns voiced. Baby does have a bruised face. His upper labial frenulum is thick and tight with an insertion point near the gum ridge. Mom reports baby can flanges lips well when latching. Baby can extend tongue pas gum ridge, and lift tongue to roof upon visual assessment only.  Discussed baby behavior, feeding frequency, baby belly size, voids, wt loss, breast changes, and nipple care. Given lactation handouts. Aware of OP services and support group.     Maternal Data Formula Feeding for Exclusion: No Does the patient have breastfeeding experience prior to this delivery?: Yes  Feeding Feeding Type: Breast Fed Length of feed: 20 min  LATCH Score/Interventions Latch: Repeated attempts needed to sustain latch, nipple held in mouth throughout feeding, stimulation needed to elicit sucking reflex. Intervention(s): Adjust position;Assist with latch  Audible Swallowing: Spontaneous and intermittent  Type of Nipple: Everted at rest and after stimulation  Comfort (Breast/Nipple): Soft / non-tender     Hold (Positioning): Assistance needed to correctly position infant at breast and maintain latch. Intervention(s): Breastfeeding basics reviewed;Support Pillows;Position options;Skin to skin  LATCH Score: 8  Lactation Tools Discussed/Used WIC Program: No   Consult Status Consult Status: Follow-up Date: 02/20/16 Follow-up type: In-patient    Rulon Eisenmengerlizabeth E Edmundo Tedesco 02/19/2016, 10:35 PM

## 2016-02-19 NOTE — Anesthesia Procedure Notes (Signed)
Epidural Patient location during procedure: OB Start time: 02/19/2016 2:08 PM End time: 02/19/2016 2:12 PM  Staffing Anesthesiologist: Leilani AbleHATCHETT, Derita Michelsen Performed by: anesthesiologist   Preanesthetic Checklist Completed: patient identified, surgical consent, pre-op evaluation, timeout performed, IV checked, risks and benefits discussed and monitors and equipment checked  Epidural Patient position: sitting Prep: site prepped and draped and DuraPrep Patient monitoring: continuous pulse ox and blood pressure Approach: midline Location: L3-L4 Injection technique: LOR air  Needle:  Needle type: Tuohy  Needle gauge: 17 G Needle length: 9 cm and 9 Needle insertion depth: 5 cm cm Catheter type: closed end flexible Catheter size: 19 Gauge Catheter at skin depth: 10 cm Test dose: negative and Other  Assessment Sensory level: T9 Events: blood not aspirated, injection not painful, no injection resistance, negative IV test and no paresthesia  Additional Notes Reason for block:procedure for pain

## 2016-02-19 NOTE — Anesthesia Preprocedure Evaluation (Signed)
Anesthesia Evaluation  Patient identified by MRN, date of birth, ID band Patient awake    Reviewed: Allergy & Precautions, H&P , NPO status , Patient's Chart, lab work & pertinent test results  Airway Mallampati: I  TM Distance: >3 FB Neck ROM: full    Dental no notable dental hx.    Pulmonary neg pulmonary ROS,    Pulmonary exam normal breath sounds clear to auscultation       Cardiovascular negative cardio ROS Normal cardiovascular exam     Neuro/Psych negative neurological ROS  negative psych ROS   GI/Hepatic negative GI ROS, Neg liver ROS,   Endo/Other  negative endocrine ROS  Renal/GU negative Renal ROS     Musculoskeletal   Abdominal Normal abdominal exam  (+)   Peds  Hematology negative hematology ROS (+)   Anesthesia Other Findings   Reproductive/Obstetrics (+) Pregnancy                             Anesthesia Physical Anesthesia Plan  ASA: II  Anesthesia Plan: Epidural   Post-op Pain Management:    Induction:   Airway Management Planned:   Additional Equipment:   Intra-op Plan:   Post-operative Plan:   Informed Consent: I have reviewed the patients History and Physical, chart, labs and discussed the procedure including the risks, benefits and alternatives for the proposed anesthesia with the patient or authorized representative who has indicated his/her understanding and acceptance.     Plan Discussed with:   Anesthesia Plan Comments:         Anesthesia Quick Evaluation  

## 2016-02-19 NOTE — Consults (Signed)
  Anesthesia Pain Consult Note  Patient: Sherry Carr, 30 y.o., female  Consult Requested by: Olivia Mackieichard Taavon, MD  Reason for Consult: CRNA pain rounds  Level of Consciousness: alert  Pain: current pain 4, pain goal 7, desires epidural       [facility] InstituteBURGER,Sherry Carr 02/19/2016

## 2016-02-20 ENCOUNTER — Encounter (HOSPITAL_COMMUNITY): Payer: Self-pay

## 2016-02-20 DIAGNOSIS — IMO0001 Reserved for inherently not codable concepts without codable children: Secondary | ICD-10-CM | POA: Diagnosis present

## 2016-02-20 DIAGNOSIS — D62 Acute posthemorrhagic anemia: Secondary | ICD-10-CM | POA: Diagnosis not present

## 2016-02-20 LAB — CBC
HEMATOCRIT: 26.5 % — AB (ref 36.0–46.0)
HEMOGLOBIN: 8.8 g/dL — AB (ref 12.0–15.0)
MCH: 28.9 pg (ref 26.0–34.0)
MCHC: 33.2 g/dL (ref 30.0–36.0)
MCV: 87.2 fL (ref 78.0–100.0)
PLATELETS: 272 10*3/uL (ref 150–400)
RBC: 3.04 MIL/uL — AB (ref 3.87–5.11)
RDW: 14 % (ref 11.5–15.5)
WBC: 11.2 10*3/uL — AB (ref 4.0–10.5)

## 2016-02-20 LAB — RPR: RPR: NONREACTIVE

## 2016-02-20 MED ORDER — POLYSACCHARIDE IRON COMPLEX 150 MG PO CAPS
150.0000 mg | ORAL_CAPSULE | Freq: Two times a day (BID) | ORAL | Status: DC
  Administered 2016-02-20 (×2): 150 mg via ORAL
  Filled 2016-02-20 (×2): qty 1

## 2016-02-20 NOTE — Progress Notes (Signed)
Patient ID: Sherry Carr, female   DOB: Oct 17, 1986, 30 y.o.   MRN: [id number] PPD # 1 SVD  S:  Reports feeling well. Desires early d/c home today.             Tolerating po/ No nausea or vomiting             Bleeding is light             Pain controlled with ibuprofen (OTC)             Up ad lib / ambulatory / voiding without difficulties    Newborn  Information for the patient's newborn:  Sherry Carr, Boy Sherry Carr [[id number]]  female  breast feeding - per RN baby not eating well and spitting up a lot / Circumcision planning   O:  A & O x 3, in no apparent distress              VS:  Filed Vitals:   02/19/16 2100 02/19/16 2158 02/20/16 0200 02/20/16 0629  BP: 113/66 104/66 121/53 107/67  Pulse: 77 83 77 72  Temp: 98 F (36.7 C) 97.9 F (36.6 C) 97.6 F (36.4 C) 97.4 F (36.3 C)  TempSrc: Oral Oral Oral Oral  Resp: 18 18 18 18   Height:      Weight:        LABS:  Recent Labs  02/19/16 1115 02/20/16 0619  WBC 9.8 11.2*  HGB 9.7* 8.8*  HCT 29.0* 26.5*  PLT 310 272    Blood type: --/--/A POS, A POS (03/22 1115)  Rubella: Immune (08/29 0000)   I&O: I/O last 3 completed shifts: In: -  Out: 1050 [Urine:950; Blood:100]             Lungs: Clear and unlabored  Heart: regular rate and rhythm / no murmurs  Abdomen: soft, non-tender, non-distended             Fundus: firm, non-tender, U-1  Perineum: 2nd degree repair healing well - no edema  Lochia: minimal  Extremities: No edema, no calf pain or tenderness, No Homans    A/P: PPD # 1  30 y.o., [id number]   Principal Problem:    Postpartum care following vaginal delivery (3/22)  Active Problems:    LGA (large for gestational age) fetus    Maternal iron deficiency anemia    Acute blood loss anemia   Doing well - stable status  Routine post partum orders  Start Niferex 150 mg BID  Stable for d/c home today - pending pediatrician / baby may not be able to go home today, if continues poor eating    Sherry Carr, Sherry Carr, M, [name],  CNM 02/20/2016, 8:33 AM

## 2016-02-20 NOTE — Anesthesia Postprocedure Evaluation (Signed)
Anesthesia Post Note  Patient: Sherry Carr  Procedure(s) Performed: * No procedures listed *  Patient location during evaluation: Mother Baby Anesthesia Type: Epidural Level of consciousness: awake, awake and alert, oriented and patient cooperative Pain management: pain level controlled Vital Signs Assessment: post-procedure vital signs reviewed and stable Respiratory status: spontaneous breathing, nonlabored ventilation and respiratory function stable Cardiovascular status: stable Postop Assessment: no headache, no backache, patient able to bend at knees and no signs of nausea or vomiting Anesthetic complications: no    Last Vitals:  Filed Vitals:   02/20/16 0200 02/20/16 0629  BP: 121/53 107/67  Pulse: 77 72  Temp: 36.4 C 36.3 C  Resp: 18 18    Last Pain:  Filed Vitals:   02/20/16 0630  PainSc: 3                  [name] L

## 2016-02-20 NOTE — Lactation Note (Signed)
This note was copied from a baby's chart. Lactation Consultation Note  Patient Name: [name] ZOXWR'UToday's Date: 02/20/2016  baby is 2719 hours old and post circ. Per mom the baby has been sleepy and spit up after the last feeding.  When checking the baby's diaper noted extra bleeding form circ site. MBU RN P. Cangialasi aware and came to check  circ site. LC applied gentle pressure with 2x2 '[facility]. Diaper changed and RN will re- check .  Baby awake and rooting. LC observed mom latching the baby. LC checked lip line and eased chin for a deeper latch .  Also prior to latch - noted areola edema, showed mom after breast massage , reverse pressure exercise and it resolved  Areola edema for a deeper latch. Baby latched well with depth, multiply swallows and gulps noted, increased with breast compressions.  Baby still feeding at 18 mins and MBU RN aware to document total time for feeding.  This mom is an experienced breast feeding mother , experienced some engorgement earlier on. Sore nipple and engorgement prevention and tx  Reviewed. Per mom has a DEBP Medela that dad will bring in tomorrow so flange and set up can be reviewed. Per mom with 1st baby didn't pump.     Maternal Data    Feeding Feeding Type: Breast Fed Length of feed: 10 min (per mom )  LATCH Score/Interventions                      Lactation Tools Discussed/Used     Consult Status      Kathrin Greathouseorio, Amy Gothard Ann 02/20/2016, 1:51 PM

## 2016-02-21 MED ORDER — IBUPROFEN 800 MG PO TABS: 800.0000 mg | ORAL_TABLET | Freq: Three times a day (TID) | ORAL | Status: DC | PRN

## 2016-02-21 MED ORDER — DOCUSATE SODIUM 100 MG PO CAPS: 100.0000 mg | ORAL_CAPSULE | Freq: Two times a day (BID) | ORAL | Status: DC

## 2016-02-21 MED ORDER — POLYSACCHARIDE IRON COMPLEX 150 MG PO CAPS: 150.0000 mg | ORAL_CAPSULE | Freq: Two times a day (BID) | ORAL | Status: DC

## 2016-02-21 MED ORDER — MAGNESIUM 400 MG PO CAPS: 400.0000 mg | ORAL_CAPSULE | Freq: Every day | ORAL | Status: DC

## 2016-02-21 NOTE — Discharge Summary (Signed)
Obstetric Discharge Summary Reason for Admission: induction of labor Prenatal Procedures: NST and ultrasound Intrapartum Procedures: spontaneous vaginal delivery with 2nd degree repair Postpartum Procedures: none Complications-Operative and Postpartum: none HEMOGLOBIN  Date Value Ref Range Status  02/20/2016 8.8* 12.0 - 15.0 g/dL Final   HCT  Date Value Ref Range Status  02/20/2016 26.5* 36.0 - 46.0 % Final    Physical Exam:  General: alert, cooperative and no distress Lochia: appropriate Uterine Fundus: firm Incision: healing well DVT Evaluation: No evidence of DVT seen on physical exam.  Discharge Diagnoses: Term Pregnancy-delivered and IDA of pregnancy compounded by ABL  Discharge Information: Date: 02/21/2016 Activity: pelvic rest Diet: routine Medications: PNV, Tylenol #3, Ibuprofen, Colace, Iron, Percocet and magnesium Condition: stable Instructions: refer to practice specific booklet Discharge to: home Follow-up Information    Follow up with Lenoard AdenAAVON,RICHARD J, MD. Schedule an appointment as soon as possible for a visit in 6 weeks.   Specialty:  Obstetrics and Gynecology   Contact information:   [address] [place] [address] [phone]       Newborn Data: Live born female  Birth Weight: 8 lb 3.8 oz (3735 g) APGAR: 9, 10  Home with mother.  Marlinda MikeBAILEY, Bertice Risse 02/21/2016, 8:52 AM

## 2016-02-21 NOTE — Progress Notes (Signed)
PPD 2 SVD  S:  Reports feeling ok - ready to go home             Tolerating po/ No nausea or vomiting             Bleeding is light             Pain controlled with motrin             Up ad lib / ambulatory / voiding QS  Newborn breast feeding   O:               VS: BP 120/83 mmHg  Pulse 76  Temp(Src) 97.9 F (36.6 C) (Oral)  Resp 16  Ht 5\' 3"  (1.6 m)  Wt 82.555 kg (182 lb)  BMI 32.25 kg/m2  SpO2 99%  LMP 05/16/2015  Breastfeeding? Unknown   LABS:              Recent Labs  02/19/16 1115 02/20/16 0619  WBC 9.8 11.2*  HGB 9.7* 8.8*  PLT 310 272               Blood type: --/--/A POS, A POS (03/22 1115)  Rubella: Immune (08/29 0000)                       Physical Exam:             Alert and oriented X3  Lungs: Clear and unlabored  Heart: regular rate and rhythm / no mumurs  Abdomen: soft, non-tender, non-distended              Fundus: firm, non-tender, U-1  Perineum: no edema  Lochia: light  Extremities: trace edema, no calf pain or tenderness    A: PPD # 2              IDA of pregnancy with ABL compounding  Doing well - stable status  P: Routine post partum orders  DC home - WOB booklet - instructions reviewed  Sherry Carr, Sherry Carr CNM, [name], FACNM 02/21/2016, 8:07 AM

## 2016-02-21 NOTE — Lactation Note (Signed)
This note was copied from a baby's chart. Lactation Consultation Note  Patient Name: Sherry Carr XBJYN'WToday's Date: 02/21/2016 Reason for consult: Follow-up assessment;Other (Comment) (6% weight loss, )  Baby is 4239 hours old, and has been consistent at the breast.  Per mom breast feeding  Is going well, nipples are sensitive, but latching is ok .  LC reminded mom to use EBM liberally on nipples and check latch.  Sore nipple and engorgement prevention and tx reviewed yesterday.  Today LC set up moms DEBP and checked flange size while mom pumped for 2-3 mins.  #24 Flange a good fit.  Referred to the Baby  ans me booklet as a resource for Breast feeding , especially pages 24 -25.  Mother informed of post-discharge support and given phone number to the lactation department, including  services for phone call assistance; out-patient appointments; and breastfeeding support group. List of other  breastfeeding resources in the community given in the handout. Encouraged mother to call for problems or concerns related to breastfeeding.   Maternal Data    Feeding Feeding Type: Breast Fed Length of feed: 27 min  LATCH Score/Interventions                Intervention(s): Breastfeeding basics reviewed     Lactation Tools Discussed/Used Pump Review: Setup, frequency, and cleaning;Milk Storage Initiated by:: MAI  Date initiated:: 02/21/16   Consult Status Consult Status: Complete Date: 02/21/16    Kathrin Greathouseorio, [name] 02/21/2016, 10:10 AM

## 2016-05-04 ENCOUNTER — Encounter (HOSPITAL_COMMUNITY): Payer: Self-pay | Admitting: Emergency Medicine

## 2016-05-04 ENCOUNTER — Emergency Department (HOSPITAL_COMMUNITY)
Admission: EM | Admit: 2016-05-04 | Discharge: 2016-05-04 | Disposition: A | Discharge status: Home / Self Care | Payer: BLUE CROSS/BLUE SHIELD | Attending: Emergency Medicine | Admitting: Emergency Medicine

## 2016-05-04 ENCOUNTER — Emergency Department (HOSPITAL_COMMUNITY): Payer: BLUE CROSS/BLUE SHIELD

## 2016-05-04 DIAGNOSIS — S0993XA Unspecified injury of face, initial encounter: Secondary | ICD-10-CM | POA: Diagnosis present

## 2016-05-04 DIAGNOSIS — R51 Headache: Secondary | ICD-10-CM | POA: Insufficient documentation

## 2016-05-04 DIAGNOSIS — S40211A Abrasion of right shoulder, initial encounter: Secondary | ICD-10-CM | POA: Insufficient documentation

## 2016-05-04 DIAGNOSIS — Y929 Unspecified place or not applicable: Secondary | ICD-10-CM | POA: Insufficient documentation

## 2016-05-04 DIAGNOSIS — Y999 Unspecified external cause status: Secondary | ICD-10-CM | POA: Diagnosis not present

## 2016-05-04 DIAGNOSIS — Y939 Activity, unspecified: Secondary | ICD-10-CM | POA: Insufficient documentation

## 2016-05-04 NOTE — ED Notes (Signed)
Pt in radiology 

## 2016-05-04 NOTE — Discharge Instructions (Signed)
General Assault  Assault includes any behavior or physical attack--whether it is on purpose or not--that results in injury to another person, damage to property, or both. This also includes assault that has not yet happened, but is planned to happen. Threats of assault may be physical, verbal, or written. They may be said or sent by:   Mail.   E-mail.   Text.   Social media.   Fax.  The threats may be direct, implied, or understood.  WHAT ARE THE DIFFERENT FORMS OF ASSAULT?  Forms of assault include:   Physically assaulting a person. This includes physical threats to inflict physical harm as well as:    Slapping.    Hitting.    Poking.    Kicking.    Punching.    Pushing.   Sexually assaulting a person. Sexual assault is any sexual activity that a person is forced, threatened, or coerced to participate in. It may or may not involve physical contact with the person who is assaulting you. You are sexually assaulted if you are forced to have sexual contact of any kind.   Damaging or destroying a person's assistive equipment, such as glasses, canes, or walkers.   Throwing or hitting objects.   Using or displaying a weapon to harm or threaten someone.   Using or displaying an object that appears to be a weapon in a threatening manner.   Using greater physical size or strength to intimidate someone.   Making intimidating or threatening gestures.   Bullying.   Hazing.   Using language that is intimidating, threatening, hostile, or abusive.   Stalking.   Restraining someone with force.  WHAT SHOULD I DO IF I EXPERIENCE ASSAULT?   Report assaults, threats, and stalking to the police. Call your local emergency services (911 in the [place] if you are in immediate danger or you need medical help.   You can work with a lawyer or an advocate to get legal protection against someone who has assaulted you or threatened you with assault. Protection includes restraining orders and private addresses. Crimes against  you, such as assault, can also be prosecuted through the courts. Laws will vary depending on where you live.     This information is not intended to replace advice given to you by your health care provider. Make sure you discuss any questions you have with your health care provider.     Document Released: 11/16/2005 Document Revised: 12/07/2014 Document Reviewed: 08/03/2014  [employer]vier Interactive Patient Education 2016 [employer]
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
## 2016-05-09 ENCOUNTER — Encounter (HOSPITAL_COMMUNITY): Payer: Self-pay | Admitting: Emergency Medicine

## 2016-05-09 ENCOUNTER — Emergency Department (HOSPITAL_COMMUNITY)
Admission: EM | Admit: 2016-05-09 | Discharge: 2016-05-09 | Disposition: A | Discharge status: Home / Self Care | Payer: BLUE CROSS/BLUE SHIELD | Attending: Emergency Medicine | Admitting: Emergency Medicine

## 2016-05-09 DIAGNOSIS — G44309 Post-traumatic headache, unspecified, not intractable: Secondary | ICD-10-CM | POA: Diagnosis present

## 2016-05-09 DIAGNOSIS — F0781 Postconcussional syndrome: Secondary | ICD-10-CM | POA: Diagnosis not present
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
## 2016-05-09 NOTE — Discharge Instructions (Signed)
Post-Concussion Syndrome  Post-concussion syndrome describes the symptoms that can occur after a head injury. These symptoms can last from weeks to months.  CAUSES   It is not clear why some head injuries cause post-concussion syndrome. It can occur whether your head injury was mild or severe and whether you were wearing head protection or not.   SIGNS AND SYMPTOMS  · Memory difficulties.  · Dizziness.  · Headaches.  · Double vision or blurry vision.  · Sensitivity to light.  · Hearing difficulties.  · Depression.  · Tiredness.  · Weakness.  · Difficulty with concentration.  · Difficulty sleeping or staying asleep.  · Vomiting.  · Poor balance or instability on your feet.  · Slow reaction time.  · Difficulty learning and remembering things you have heard.  DIAGNOSIS   There is no test to determine whether you have post-concussion syndrome. Your health care provider may order an imaging scan of your brain, such as a CT scan, to check for other problems that may be causing your symptoms (such as a severe injury inside your skull).  TREATMENT   Usually, these problems disappear over time without medical care. Your health care provider may prescribe medicine to help ease your symptoms. It is important to follow up with a neurologist to evaluate your recovery and address any lingering symptoms or issues.  HOME CARE INSTRUCTIONS   · Take medicines only as directed by your health care provider. Do not take aspirin. Aspirin can slow blood clotting.  · Sleep with your head slightly elevated to help with headaches.  · Avoid any situation where there is potential for another head injury. This includes football, hockey, soccer, basketball, martial arts, downhill snow sports, and horseback riding. Your condition will get worse every time you experience a concussion. You should avoid these activities until you are evaluated by the appropriate follow-up health care providers.  · Keep all follow-up visits as directed by your health  care provider. This is important.  SEEK MEDICAL CARE IF:  · You have increased problems paying attention or concentrating.  · You have increased difficulty remembering or learning new information.  · You need more time to complete tasks or assignments than before.  · You have increased irritability or decreased ability to cope with stress.  · You have more symptoms than before.  Seek medical care if you have any of the following symptoms for more than two weeks after your injury:  · Lasting (chronic) headaches.  · Dizziness or balance problems.  · Nausea.  · Vision problems.  · Increased sensitivity to noise or light.  · Depression or mood swings.  · Anxiety or irritability.  · Memory problems.  · Difficulty concentrating or paying attention.  · Sleep problems.  · Feeling tired all the time.  SEEK IMMEDIATE MEDICAL CARE IF:  · You have confusion or unusual drowsiness.  · Others find it difficult to wake you up.  · You have nausea or persistent, forceful vomiting.  · You feel like you are moving when you are not (vertigo). Your eyes may move rapidly back and forth.  · You have convulsions or faint.  · You have severe, persistent headaches that are not relieved by medicine.  · You cannot use your arms or legs normally.  · One of your pupils is larger than the other.  · You have clear or bloody discharge from your nose or ears.  · Your problems are getting worse, not better.  MAKE   SURE YOU:  · Understand these instructions.  · Will watch your condition.  · Will get help right away if you are not doing well or get worse.     This information is not intended to replace advice given to you by your health care provider. Make sure you discuss any questions you have with your health care provider.     Document Released: 05/08/2002 Document Revised: 12/07/2014 Document Reviewed: 02/21/2014  Elsevier Interactive Patient Education ©2016 [employer]

## 2017-03-24 ENCOUNTER — Emergency Department (HOSPITAL_COMMUNITY): Payer: Medicaid Other

## 2017-03-24 ENCOUNTER — Encounter (HOSPITAL_COMMUNITY): Payer: Self-pay | Admitting: Vascular Surgery

## 2017-03-24 DIAGNOSIS — R0789 Other chest pain: Secondary | ICD-10-CM | POA: Insufficient documentation

## 2017-03-24 DIAGNOSIS — R079 Chest pain, unspecified: Secondary | ICD-10-CM | POA: Diagnosis present

## 2017-03-24 LAB — BASIC METABOLIC PANEL
ANION GAP: 7 (ref 5–15)
BUN: 12 mg/dL (ref 6–20)
CALCIUM: 10 mg/dL (ref 8.9–10.3)
CO2: 25 mmol/L (ref 22–32)
CREATININE: 0.82 mg/dL (ref 0.44–1.00)
Chloride: 107 mmol/L (ref 101–111)
GLUCOSE: 95 mg/dL (ref 65–99)
Potassium: 3.8 mmol/L (ref 3.5–5.1)
Sodium: 139 mmol/L (ref 135–145)

## 2017-03-24 LAB — I-STAT BETA HCG BLOOD, ED (MC, WL, AP ONLY)

## 2017-03-24 LAB — CBC
HCT: 37.2 % (ref 36.0–46.0)
Hemoglobin: 12.5 g/dL (ref 12.0–15.0)
MCH: 30.8 pg (ref 26.0–34.0)
MCHC: 33.6 g/dL (ref 30.0–36.0)
MCV: 91.6 fL (ref 78.0–100.0)
PLATELETS: 264 10*3/uL (ref 150–400)
RBC: 4.06 MIL/uL (ref 3.87–5.11)
RDW: 12.8 % (ref 11.5–15.5)
WBC: 9.5 10*3/uL (ref 4.0–10.5)

## 2017-03-24 LAB — I-STAT TROPONIN, ED: TROPONIN I, POC: 0 ng/mL (ref 0.00–0.08)

## 2017-03-24 NOTE — ED Triage Notes (Signed)
Pt reports to the ED for eval of left sided CP. She states that she has had this pain intermittently for the past few weeks. She states that this episode was brought on by walking up the stairs but other times it can occur at rest. She is breast feeding at this time. Denies any erythema or swelling to her breast. She reports some associated SOB and lightheadedness. Pt denies any aggravating or relieving factors. The CP normally lasts only for a few minutes but she states that it has been lasting long. Pt reports this episode has been going on for over almost 2 hours.

## 2017-03-24 NOTE — ED Notes (Signed)
Pt and family updated on wait time 

## 2017-03-25 ENCOUNTER — Emergency Department (HOSPITAL_COMMUNITY)
Admission: EM | Admit: 2017-03-25 | Discharge: 2017-03-25 | Disposition: A | Discharge status: Home / Self Care | Payer: Medicaid Other | Attending: Emergency Medicine | Admitting: Emergency Medicine

## 2017-03-25 DIAGNOSIS — R0789 Other chest pain: Secondary | ICD-10-CM

## 2017-03-25 MED ORDER — DIPHENHYDRAMINE HCL 25 MG PO CAPS
25.0000 mg | ORAL_CAPSULE | Freq: Once | ORAL | Status: AC
  Administered 2017-03-25: 25 mg via ORAL
  Filled 2017-03-25: qty 1

## 2017-03-25 NOTE — ED Notes (Signed)
Pt reports itching from monitoring pads and bp cuff. Dr Judd Lien made aware and orders received

## 2017-03-25 NOTE — ED Provider Notes (Signed)
[facility]-EMERGENCY DEPT Provider Note   CSN: [id number] Arrival date & time: 03/24/17  2049  By signing my name below, I, Elder Negus, attest that this documentation has been prepared under the direction and in the presence of Geoffery Lyons, MD. Electronically Signed: Elder Negus, Scribe. 03/25/17. 1:00 AM.   History   Chief Complaint Chief Complaint  Patient presents with  . Chest Pain    HPI Sherry Carr is a 31 y.o. female without any chronic medical problems who presents to the ED for evaluation of chest pain. This patient states that for the last "couple months" she has experienced intermittent "squeezing" chest pain with dyspnea that onsets randomly for "around a minute". Not exertional. Tonight she had sudden onset of these symptoms that lasted persistently for 1 hour. Since arrival in triage, her symptoms have resolved and she is currently at her normal health. No fevers or cough. Unknown family history. She is not a tobacco user.  The history is provided by the patient. No language interpreter was used.  Chest Pain   This is a recurrent problem. Episode onset: 2 months ago. The problem occurs daily. The problem has been resolved. Associated with: nothing.    Past Medical History:  Diagnosis Date  . Acute blood loss anemia 02/20/2016  . Contact with or exposure to other viral diseases(V01.79)   . Endometriosis   . H/O varicella   . Maternal iron deficiency anemia 02/20/2016  . Medical history non-contributory   . Normal labor 08/04/2012  . NSVD (normal spontaneous vaginal delivery, 2nd deg lac, 9/5) 08/05/2012  . Ovarian cyst   . Postpartum care following vaginal delivery (3/22) 02/20/2016  . Pregnant     Patient Active Problem List   Diagnosis Date Noted  . Postpartum care following vaginal delivery (3/22) 02/20/2016  . Maternal iron deficiency anemia 02/20/2016  . Acute blood loss anemia 02/20/2016  . LGA (large for gestational age) fetus 02/19/2016    Past  Surgical History:  Procedure Laterality Date  . COLONOSCOPY    . LAPAROSCOPIC ENDOMETRIOSIS FULGURATION    . LAPAROSCOPY N/A 03/07/2014   Procedure: LAPAROSCOPY DIAGNOSTIC;  Surgeon: Lenoard Aden, MD;  Location: [facility];  Service: Gynecology;  Laterality: N/A;  . ROBOTIC ASSISTED LAPAROSCOPIC LYSIS OF ADHESION N/A 03/07/2014   Procedure: ROBOTIC ASSISTED LAPAROSCOPIC LYSIS OF ADHESION; Ablation of Endometriosis;  Surgeon: Lenoard Aden, MD;  Location: [facility];  Service: Gynecology;  Laterality: N/A;  . TONSILLECTOMY    . UPPER GASTROINTESTINAL ENDOSCOPY    . WISDOM TOOTH EXTRACTION      OB History    Gravida Para Term Preterm AB Living   SAB TAB Ectopic Multiple Live Births   1     0 2       Home Medications    Prior to Admission medications   Medication Sig Start Date End Date Taking? Authorizing Provider  calcium carbonate (TUMS - DOSED IN MG ELEMENTAL CALCIUM) 500 MG chewable tablet Chew 2 tablets by mouth 3 (three) times daily as needed for indigestion or heartburn.    Historical Provider, MD  docusate sodium (COLACE) 100 MG capsule Take 1 capsule (100 mg total) by mouth 2 (two) times daily. 02/21/16   Marlinda Mike, CNM  hydrocortisone (ANUSOL-HC) 2.5 % rectal cream Place 1 application rectally 2 (two) times daily as needed for hemorrhoids or itching.    Historical Provider, MD  ibuprofen (ADVIL,MOTRIN) 800 MG tablet Take 1 tablet (  800 mg total) by mouth every 8 (eight) hours as needed for moderate pain or cramping (bleeding). 02/21/16   Marlinda Mike, CNM  iron polysaccharides (NIFEREX) 150 MG capsule Take 1 capsule (150 mg total) by mouth 2 (two) times daily. 02/21/16   Marlinda Mike, CNM  Magnesium 400 MG CAPS Take 400 mg by mouth daily. 02/21/16   Marlinda Mike, CNM  Prenatal Vit-Fe Fumarate-FA (PRENATAL MULTIVITAMIN) TABS tablet Take 1 tablet by mouth daily at 12 noon.    Historical Provider, MD    Family History Family History  Problem Relation Age of Onset  .  Adopted: Yes  . Anesthesia problems Neg Hx     Social History Social History  Substance Use Topics  . Smoking status: Never Smoker  . Smokeless tobacco: Never Used  . Alcohol use No     Allergies   Patient has no known allergies.   Review of Systems Review of Systems All systems reviewed and are negative for acute change except as noted in the HPI.  Physical Exam Updated Vital Signs BP 118/67 (BP Location: Right Arm)   Pulse 79   Temp 97.9 F (36.6 C) (Oral)   Resp 18   SpO2 98%   Physical Exam  Constitutional: She appears well-developed and well-nourished. No distress.  HENT:  Head: Normocephalic and atraumatic.  Eyes: Conjunctivae are normal.  Neck: Neck supple.  Cardiovascular: Normal rate and regular rhythm.   No murmur heard. Pulmonary/Chest: Effort normal and breath sounds normal. No respiratory distress.  Abdominal: Soft. There is no tenderness.  Musculoskeletal: She exhibits no edema.  Neurological: She is alert.  Skin: Skin is warm and dry.  Psychiatric: She has a normal mood and affect.  Nursing note and vitals reviewed.    ED Treatments / Results  Labs (all labs ordered are listed, but only abnormal results are displayed) Labs Reviewed  BASIC METABOLIC PANEL  CBC  I-STAT TROPOININ, ED  I-STAT BETA HCG BLOOD, ED ([facility], [facility], [facility] ONLY)    EKG  EKG Interpretation  Date/Time:  Wednesday March 24 2017 20:54:35 EDT Ventricular Rate:  87 PR Interval:  144 QRS Duration: 92 QT Interval:  384 QTC Calculation: 462 R Axis:   53 Text Interpretation:  Normal sinus rhythm with sinus arrhythmia Incomplete right bundle branch block Borderline ECG Confirmed by Armandina Iman  MD, Sanita Estrada ([id number]) on 03/25/2017 12:20:35 AM       Radiology Dg Chest 2 View  Result Date: 03/24/2017 CLINICAL DATA:  Chest pain and pressure for 2 months EXAM: CHEST  2 VIEW COMPARISON:  None. FINDINGS: The heart size and mediastinal contours are within normal limits. Both lungs are clear.  The visualized skeletal structures are unremarkable. IMPRESSION: No active cardiopulmonary disease. Electronically Signed   By: Alcide Clever M.D.   On: 03/24/2017 21:34    Procedures Procedures (including critical care time)  Medications Ordered in ED Medications - No data to display   Initial Impression / Assessment and Plan / ED Course  I have reviewed the triage vital signs and the nursing notes.  Pertinent labs & imaging results that were available during my care of the patient were reviewed by me and considered in my medical decision making (see chart for details).  Asian presents with complaints of chest pain that appears musculoskeletal in nature. EKG is unremarkable and chest x-ray is normal. She will be discharged, to return as needed for any problems.    Final Clinical Impressions(s) / ED Diagnoses   Final diagnoses:  None    New Prescriptions New Prescriptions   No medications on file  I personally performed the services described in this documentation, which was scribed in my presence. The recorded information has been reviewed and is accurate.       Geoffery Lyons, MD 03/28/17 [phone]

## 2017-03-25 NOTE — Discharge Instructions (Signed)
Ibuprofen 600 mg 3 times daily for the next 5 days.  Follow-up with cardiology if you are not improving in the next week. The contact information for the [facility] cardiology clinic has been provided in this discharge summary for you to call and make these arrangements.  Return to the emergency department in the meantime if your symptoms significantly worsen or change.

## 2017-03-25 NOTE — ED Notes (Signed)
Nurse First rounds: nurse explained delay , process and census to pt.

## 2017-04-03 IMAGING — US US OB COMP LESS 14 WK
1 series · 14 of 20 positions shown · non-contrast
Comparison: None applicable

CLINICAL DATA: Right lower quadrant pain for 3 days. LMP
05/16/2015. Gravida 3 para 1. Quantitative beta HCG is greater than
1111. By LMP patient is 13 weeks 4 days.

EXAM:
OBSTETRIC <14 WK ULTRASOUND
TECHNIQUE: Transabdominal ultrasound was performed for evaluation of the
gestation as well as the maternal uterus and adnexal regions.

[Series 1: us ob comp less 14 wk · 0.22mm/px · 14 of 20 slices shown]
[im 1/20]
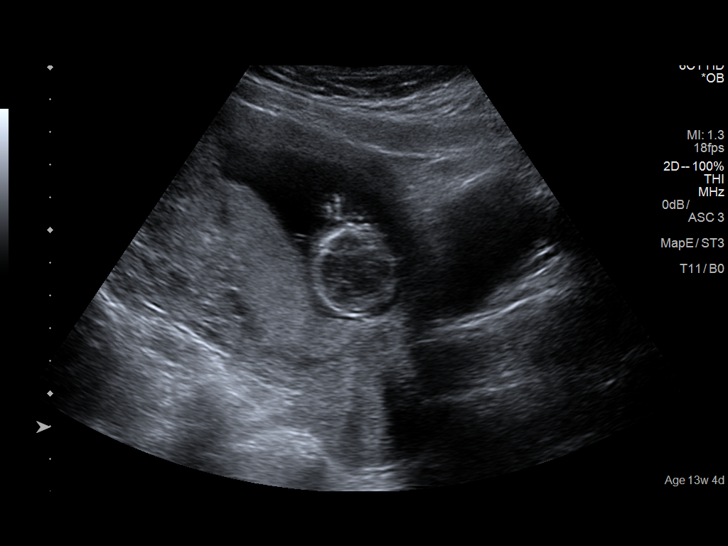
[im 3/20]
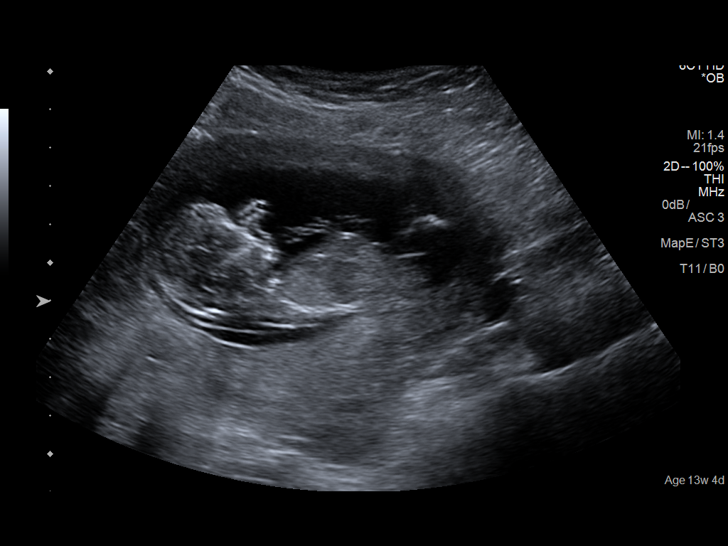
[im 4/20]
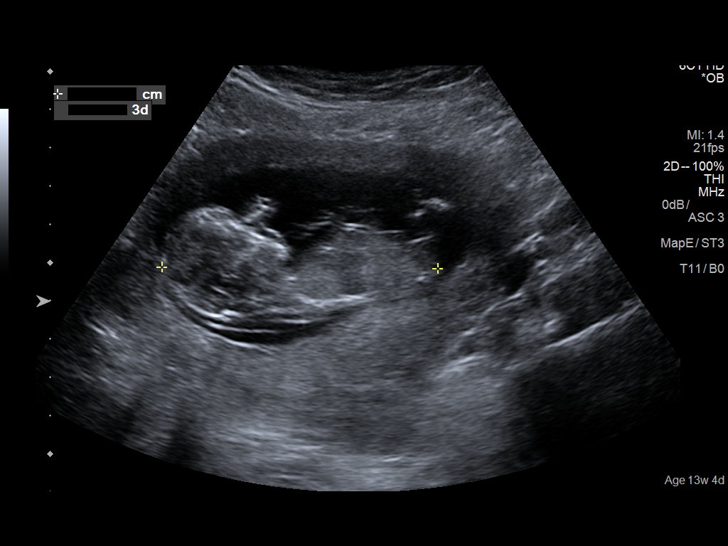
[im 6/20]
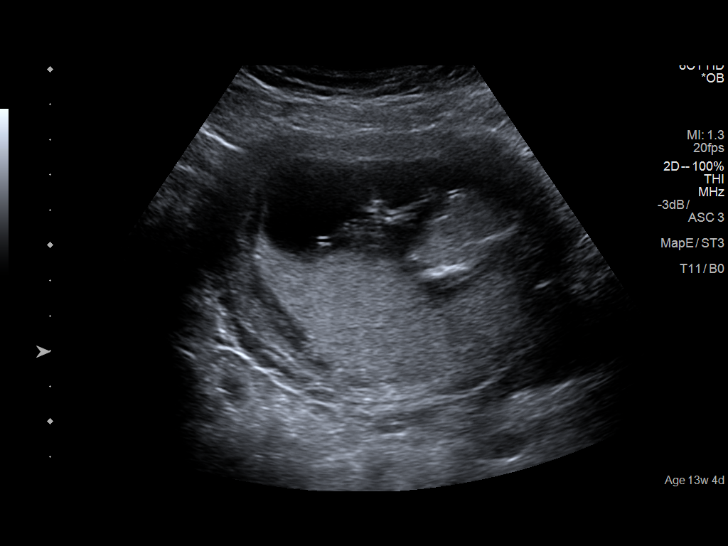
[im 7/20]
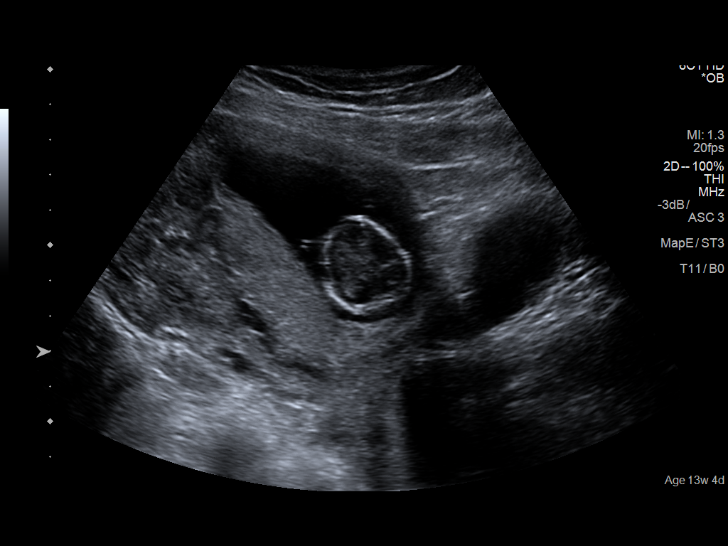
[im 8/20]
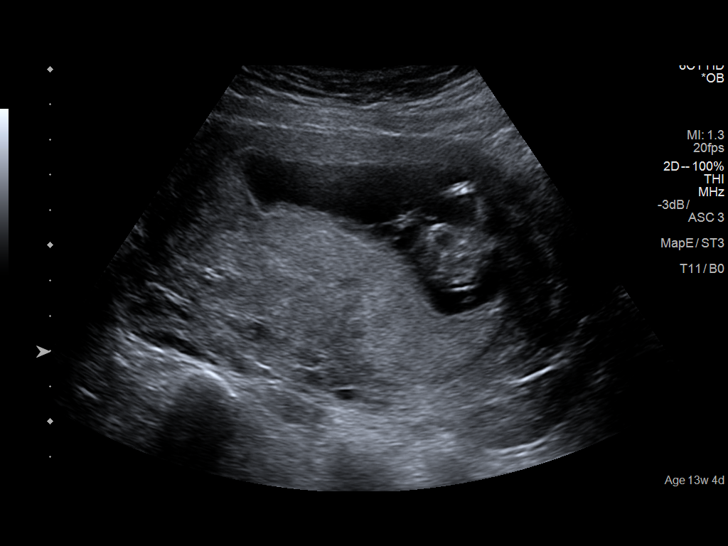
[im 10/20]
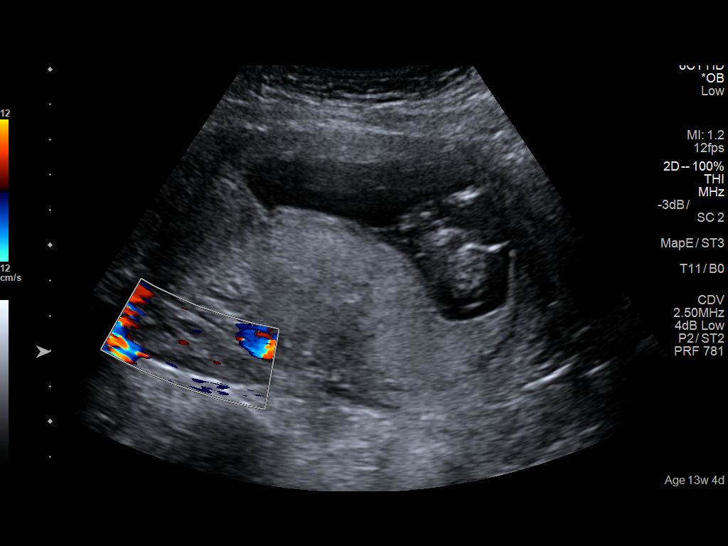
[im 11/20]
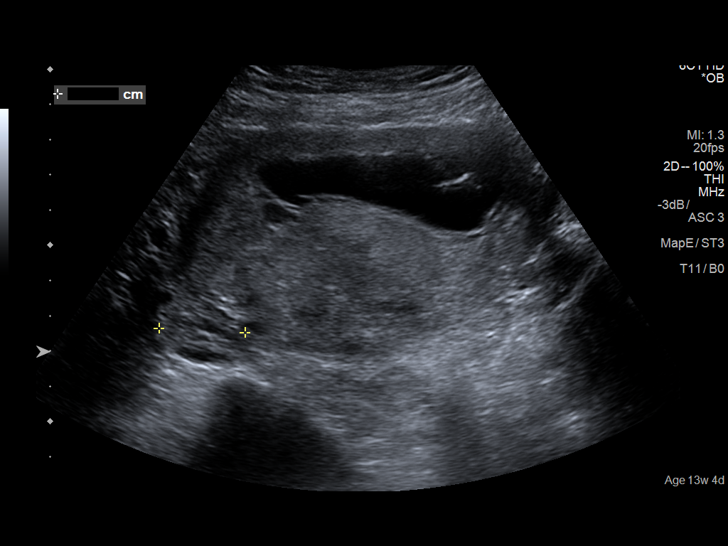
[im 13/20]
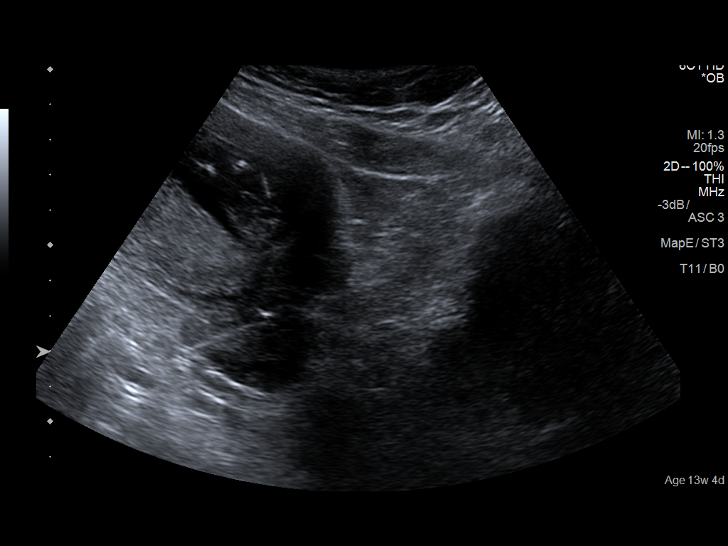
[im 14/20]
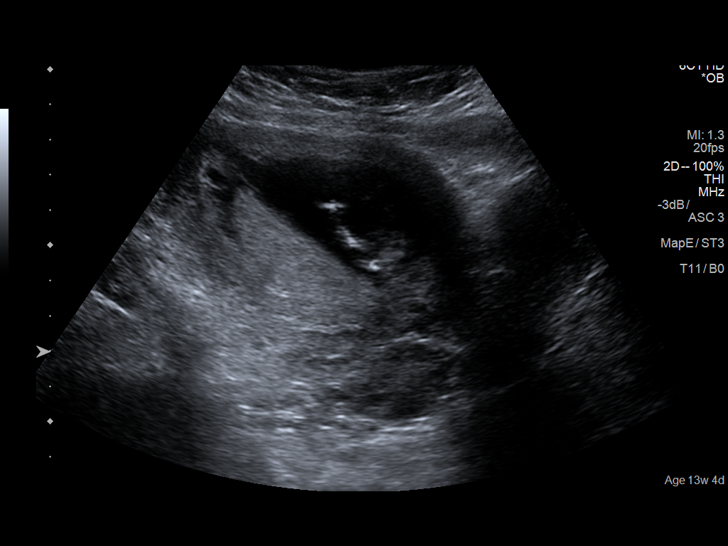
[im 16/20]
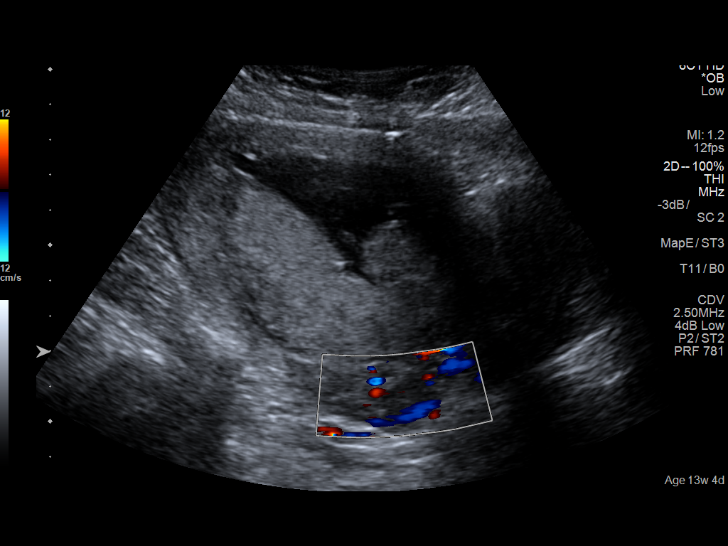
[im 17/20]
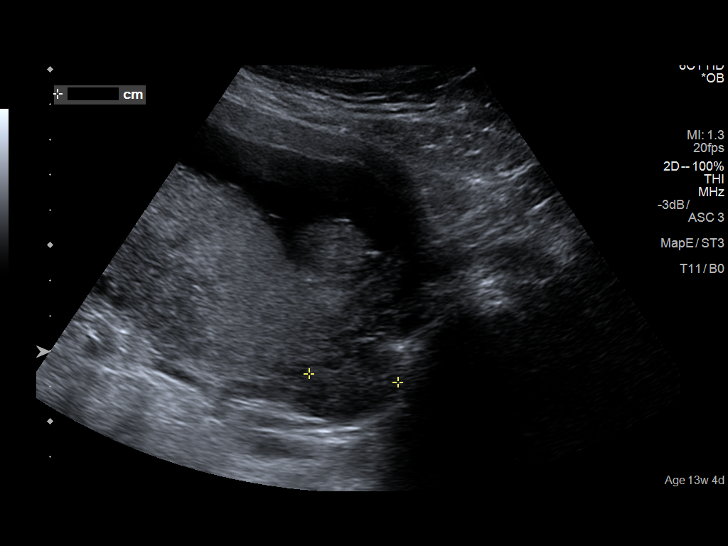
[im 18/20]
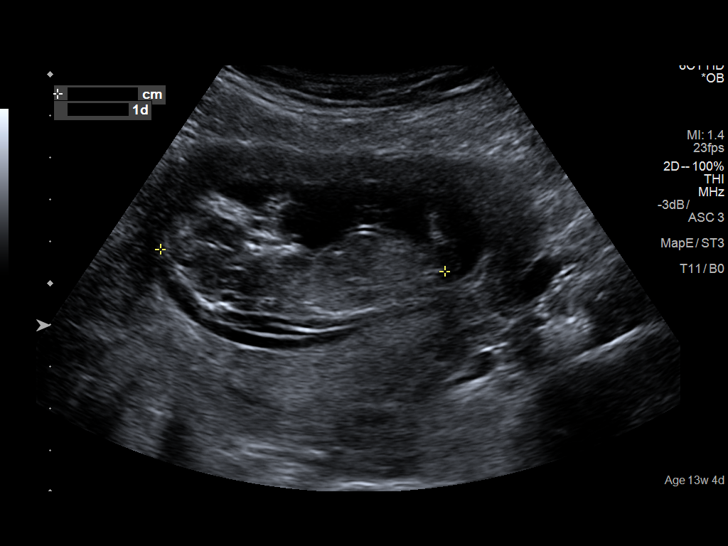
[im 20/20]
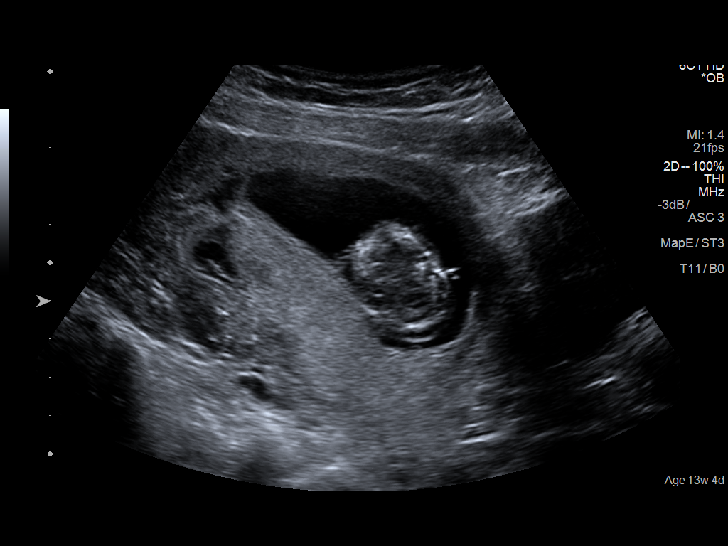

[14 of 20 positions shown; findings below may reference images not displayed]

FINDINGS: Intrauterine gestational sac: Visualized/normal in shape.

Yolk sac:  Present

Embryo:  Present

Cardiac Activity: Present

Heart Rate: 157 bpm

CRL:   70.9  mm   13 w 2 d                  US EDC: 02/22/2016

Maternal uterus/adnexae: Normal appearance of the ovaries. Small
subchorionic hemorrhage. No free pelvic fluid.
IMPRESSION: 1. Single living intrauterine embryo.
2. Size and dates correlate well.
3. Today's ultrasound confirms clinical EDC of 02/20/2016.
4. Small subchorionic hemorrhage.  No adnexal mass.

## 2018-03-24 ENCOUNTER — Other Ambulatory Visit: Payer: Self-pay | Admitting: Obstetrics and Gynecology
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
## 2018-04-05 NOTE — Patient Instructions (Addendum)
Sherry Carr  04/05/2018   Your procedure is scheduled on:    04-12-18   Report to [facility] Main  Entrance    Report to admitting at    11:00AM    Call this number if you have problems the morning of surgery [phone]     Remember: Do not eat food After Midnight. You may have clear liquids (see diet below) from midnight until 7:00am day of surgery. Nothing by mouth after 7:00am!     Take these medicines the morning of surgery with A SIP OF WATER:  Tramadol or hydrocodone if needed                                You may not have any metal on your body including hair pins and              piercings  Do not wear jewelry, make-up, lotions, powders or perfumes, deodorant             Do not wear nail polish.  Do not shave  48 hours prior to surgery.               Do not bring valuables to the hospital. [facility] IS NOT             RESPONSIBLE   FOR VALUABLES.  Contacts, dentures or bridgework may not be worn into surgery.      Patients discharged the day of surgery will not be allowed to drive home.  Name and phone number of your driver:  Special Instructions: N/A              Please read over the following fact sheets you were given: _____________________________________________________________________     CLEAR LIQUID DIET   Foods Allowed                                                                     Foods Excluded  Coffee and tea, regular and decaf                             liquids that you cannot  Plain Jell-O in any flavor                                             see through such as: Fruit ices (not with fruit pulp)                                     milk, soups, orange juice  Iced Popsicles                                    All solid food Carbonated beverages, regular and diet  Cranberry, grape and apple juices Sports drinks like Gatorade Lightly seasoned clear broth or consume(fat  free) Sugar, honey syrup  Sample Menu Breakfast                                Lunch                                     Supper Cranberry juice                    Beef broth                            Chicken broth Jell-O                                     Grape juice                           Apple juice Coffee or tea                        Jell-O                                      Popsicle                                                Coffee or tea                        Coffee or tea  _____________________________________________________________________  [facility] - Preparing for Surgery Before surgery, you can play an important role.  Because skin is not sterile, your skin needs to be as free of germs as possible.  You can reduce the number of germs on your skin by washing with CHG (chlorahexidine gluconate) soap before surgery.  CHG is an antiseptic cleaner which kills germs and bonds with the skin to continue killing germs even after washing. Please DO NOT use if you have an allergy to CHG or antibacterial soaps.  If your skin becomes reddened/irritated stop using the CHG and inform your nurse when you arrive at Short Stay. Do not shave (including legs and underarms) for at least 48 hours prior to the first CHG shower.  You may shave your face/neck. Please follow these instructions carefully:  1.  Shower with CHG Soap the night before surgery and the  morning of Surgery.  2.  If you choose to wash your hair, wash your hair first as usual with your  normal  shampoo.  3.  After you shampoo, rinse your hair and body thoroughly to remove the  shampoo.                           4.  Use CHG as you would any other liquid soap.  You can apply chg directly  to the skin and wash  Gently with a scrungie or clean washcloth.  5.  Apply the CHG Soap to your body ONLY FROM THE NECK DOWN.   Do not use on face/ open                           Wound or open sores. Avoid contact  with eyes, ears mouth and genitals (private parts).                       Wash face,  Genitals (private parts) with your normal soap.             6.  Wash thoroughly, paying special attention to the area where your surgery  will be performed.  7.  Thoroughly rinse your body with warm water from the neck down.  8.  DO NOT shower/wash with your normal soap after using and rinsing off  the CHG Soap.                9.  Pat yourself dry with a clean towel.            10.  Wear clean pajamas.            11.  Place clean sheets on your bed the night of your first shower and do not  sleep with pets. Day of Surgery : Do not apply any lotions/deodorants the morning of surgery.  Please wear clean clothes to the hospital/surgery center.  FAILURE TO FOLLOW THESE INSTRUCTIONS MAY RESULT IN THE CANCELLATION OF YOUR SURGERY PATIENT SIGNATURE_________________________________  NURSE SIGNATURE__________________________________  ________________________________________________________________________

## 2018-04-05 NOTE — Progress Notes (Signed)
ekg 03-25-17 epic

## 2018-04-06 ENCOUNTER — Encounter (HOSPITAL_COMMUNITY): Payer: Self-pay

## 2018-04-06 ENCOUNTER — Encounter (HOSPITAL_COMMUNITY)
Admission: RE | Admit: 2018-04-06 | Discharge: 2018-04-06 | Disposition: A | Discharge status: Home / Self Care | Payer: BLUE CROSS/BLUE SHIELD | Source: Ambulatory Visit | Attending: Obstetrics and Gynecology | Admitting: Obstetrics and Gynecology

## 2018-04-06 ENCOUNTER — Other Ambulatory Visit: Payer: Self-pay

## 2018-04-06 DIAGNOSIS — Z01812 Encounter for preprocedural laboratory examination: Secondary | ICD-10-CM | POA: Insufficient documentation

## 2018-04-06 DIAGNOSIS — R1031 Right lower quadrant pain: Secondary | ICD-10-CM | POA: Diagnosis not present

## 2018-04-06 DIAGNOSIS — N946 Dysmenorrhea, unspecified: Secondary | ICD-10-CM | POA: Insufficient documentation

## 2018-04-06 HISTORY — DX: Pelvic and perineal pain: R10.2

## 2018-04-06 HISTORY — DX: Unspecified right bundle-branch block: I45.10

## 2018-04-06 HISTORY — DX: Other chronic pain: G89.29

## 2018-04-06 HISTORY — DX: Dysmenorrhea, unspecified: N94.6

## 2018-04-06 HISTORY — DX: Right lower quadrant pain: R10.31

## 2018-04-06 LAB — CBC
HCT: 35.6 % — ABNORMAL LOW (ref 36.0–46.0)
Hemoglobin: 11.8 g/dL — ABNORMAL LOW (ref 12.0–15.0)
MCH: 31.1 pg (ref 26.0–34.0)
MCHC: 33.1 g/dL (ref 30.0–36.0)
MCV: 93.9 fL (ref 78.0–100.0)
PLATELETS: 267 10*3/uL (ref 150–400)
RBC: 3.79 MIL/uL — ABNORMAL LOW (ref 3.87–5.11)
RDW: 12.6 % (ref 11.5–15.5)
WBC: 6.4 10*3/uL (ref 4.0–10.5)

## 2018-04-06 LAB — ABO/RH: ABO/RH(D): A POS

## 2018-04-06 LAB — HCG, SERUM, QUALITATIVE: Preg, Serum: NEGATIVE

## 2018-04-12 ENCOUNTER — Encounter (HOSPITAL_COMMUNITY)
Admission: RE | Disposition: A | Discharge status: Home / Self Care | Payer: Self-pay | Source: Ambulatory Visit | Attending: Obstetrics and Gynecology

## 2018-04-12 ENCOUNTER — Ambulatory Visit (HOSPITAL_COMMUNITY): Payer: BLUE CROSS/BLUE SHIELD | Admitting: Anesthesiology

## 2018-04-12 ENCOUNTER — Ambulatory Visit (HOSPITAL_COMMUNITY)
Admission: RE | Admit: 2018-04-12 | Discharge: 2018-04-12 | Disposition: A | Discharge status: Home / Self Care | Payer: BLUE CROSS/BLUE SHIELD | Source: Ambulatory Visit | Attending: Obstetrics and Gynecology | Admitting: Obstetrics and Gynecology

## 2018-04-12 ENCOUNTER — Encounter (HOSPITAL_COMMUNITY): Payer: Self-pay | Admitting: Anesthesiology

## 2018-04-12 DIAGNOSIS — N803 Endometriosis of pelvic peritoneum: Secondary | ICD-10-CM | POA: Diagnosis not present

## 2018-04-12 DIAGNOSIS — N736 Female pelvic peritoneal adhesions (postinfective): Secondary | ICD-10-CM | POA: Insufficient documentation

## 2018-04-12 DIAGNOSIS — N838 Other noninflammatory disorders of ovary, fallopian tube and broad ligament: Secondary | ICD-10-CM | POA: Insufficient documentation

## 2018-04-12 DIAGNOSIS — Z79899 Other long term (current) drug therapy: Secondary | ICD-10-CM | POA: Insufficient documentation

## 2018-04-12 DIAGNOSIS — D649 Anemia, unspecified: Secondary | ICD-10-CM | POA: Insufficient documentation

## 2018-04-12 DIAGNOSIS — N941 Unspecified dyspareunia: Secondary | ICD-10-CM | POA: Insufficient documentation

## 2018-04-12 DIAGNOSIS — N946 Dysmenorrhea, unspecified: Secondary | ICD-10-CM | POA: Insufficient documentation

## 2018-04-12 DIAGNOSIS — R102 Pelvic and perineal pain: Secondary | ICD-10-CM | POA: Diagnosis not present

## 2018-04-12 HISTORY — PX: ROBOTIC ASSISTED LAPAROSCOPIC LYSIS OF ADHESION: SHX6080

## 2018-04-12 LAB — TYPE AND SCREEN
ABO/RH(D): A POS
ANTIBODY SCREEN: NEGATIVE

## 2018-04-12 SURGERY — LYSIS, ADHESIONS, ROBOT-ASSISTED, LAPAROSCOPIC
Anesthesia: General | Site: Abdomen

## 2018-04-12 MED ORDER — SODIUM CHLORIDE 0.9 % IJ SOLN
INTRAMUSCULAR | Status: AC
Start: 2018-04-12 — End: ?
  Filled 2018-04-12: qty 30

## 2018-04-12 MED ORDER — CEFAZOLIN SODIUM-DEXTROSE 2-4 GM/100ML-% IV SOLN
2.0000 g | INTRAVENOUS | Status: AC
  Administered 2018-04-12: 2 g via INTRAVENOUS
  Filled 2018-04-12: qty 100

## 2018-04-12 MED ORDER — SUGAMMADEX SODIUM 200 MG/2ML IV SOLN
INTRAVENOUS | Status: AC
  Filled 2018-04-12: qty 2

## 2018-04-12 MED ORDER — ONDANSETRON HCL 4 MG/2ML IJ SOLN
INTRAMUSCULAR | Status: DC | PRN
  Administered 2018-04-12: 4 mg via INTRAVENOUS

## 2018-04-12 MED ORDER — ONDANSETRON HCL 4 MG/2ML IJ SOLN
INTRAMUSCULAR | Status: AC
Start: 2018-04-12 — End: ?
  Filled 2018-04-12: qty 2

## 2018-04-12 MED ORDER — DEXAMETHASONE SODIUM PHOSPHATE 10 MG/ML IJ SOLN
INTRAMUSCULAR | Status: AC
Start: 2018-04-12 — End: ?
  Filled 2018-04-12: qty 1

## 2018-04-12 MED ORDER — ROPIVACAINE HCL 5 MG/ML IJ SOLN
INTRAMUSCULAR | Status: AC
  Filled 2018-04-12: qty 30

## 2018-04-12 MED ORDER — SODIUM CHLORIDE 0.9 % IV SOLN
INTRAVENOUS | Status: DC | PRN
  Administered 2018-04-12: 60 mL

## 2018-04-12 MED ORDER — PROPOFOL 10 MG/ML IV BOLUS
INTRAVENOUS | Status: DC | PRN
  Administered 2018-04-12: 160 mg via INTRAVENOUS

## 2018-04-12 MED ORDER — SODIUM CHLORIDE 0.9 % IR SOLN
Status: DC | PRN
  Administered 2018-04-12: 1000 mL

## 2018-04-12 MED ORDER — PROPOFOL 10 MG/ML IV BOLUS
INTRAVENOUS | Status: AC
  Filled 2018-04-12: qty 40

## 2018-04-12 MED ORDER — HYDROMORPHONE HCL 1 MG/ML IJ SOLN
0.2500 mg | INTRAMUSCULAR | Status: DC | PRN
  Administered 2018-04-12 (×4): 0.5 mg via INTRAVENOUS

## 2018-04-12 MED ORDER — MIDAZOLAM HCL 2 MG/2ML IJ SOLN
INTRAMUSCULAR | Status: DC | PRN
  Administered 2018-04-12: 2 mg via INTRAVENOUS

## 2018-04-12 MED ORDER — SUGAMMADEX SODIUM 200 MG/2ML IV SOLN
INTRAVENOUS | Status: DC | PRN
  Administered 2018-04-12: 200 mg via INTRAVENOUS

## 2018-04-12 MED ORDER — MEPERIDINE HCL 50 MG/ML IJ SOLN: 6.2500 mg | INTRAMUSCULAR | Status: DC | PRN

## 2018-04-12 MED ORDER — KETOROLAC TROMETHAMINE 30 MG/ML IJ SOLN
INTRAMUSCULAR | Status: DC | PRN
  Administered 2018-04-12: 30 mg via INTRAVENOUS

## 2018-04-12 MED ORDER — MIDAZOLAM HCL 2 MG/2ML IJ SOLN
INTRAMUSCULAR | Status: AC
  Filled 2018-04-12: qty 2

## 2018-04-12 MED ORDER — OXYCODONE HCL 5 MG/5ML PO SOLN
5.0000 mg | Freq: Once | ORAL | Status: AC | PRN
  Filled 2018-04-12: qty 5

## 2018-04-12 MED ORDER — OXYCODONE HCL 5 MG PO TABS: 5.0000 mg | ORAL_TABLET | Freq: Four times a day (QID) | ORAL | 0 refills | Status: AC | PRN

## 2018-04-12 MED ORDER — LACTATED RINGERS IV SOLN
INTRAVENOUS | Status: DC
  Administered 2018-04-12 (×2): via INTRAVENOUS

## 2018-04-12 MED ORDER — FENTANYL CITRATE (PF) 100 MCG/2ML IJ SOLN
INTRAMUSCULAR | Status: AC
  Filled 2018-04-12: qty 2

## 2018-04-12 MED ORDER — FENTANYL CITRATE (PF) 250 MCG/5ML IJ SOLN
INTRAMUSCULAR | Status: AC
  Filled 2018-04-12: qty 5

## 2018-04-12 MED ORDER — LIDOCAINE 2% (20 MG/ML) 5 ML SYRINGE
INTRAMUSCULAR | Status: DC | PRN
Start: 2018-04-12 — End: 2018-04-12
  Administered 2018-04-12: 100 mg via INTRAVENOUS

## 2018-04-12 MED ORDER — HYDROMORPHONE HCL 1 MG/ML IJ SOLN
INTRAMUSCULAR | Status: AC
  Filled 2018-04-12: qty 1

## 2018-04-12 MED ORDER — FENTANYL CITRATE (PF) 100 MCG/2ML IJ SOLN
INTRAMUSCULAR | Status: DC | PRN
  Administered 2018-04-12 (×2): 100 ug via INTRAVENOUS
  Administered 2018-04-12 (×3): 50 ug via INTRAVENOUS

## 2018-04-12 MED ORDER — DEXAMETHASONE SODIUM PHOSPHATE 10 MG/ML IJ SOLN
INTRAMUSCULAR | Status: DC | PRN
  Administered 2018-04-12: 10 mg via INTRAVENOUS

## 2018-04-12 MED ORDER — ROCURONIUM BROMIDE 10 MG/ML (PF) SYRINGE
PREFILLED_SYRINGE | INTRAVENOUS | Status: DC | PRN
  Administered 2018-04-12: 20 mg via INTRAVENOUS
  Administered 2018-04-12: 50 mg via INTRAVENOUS

## 2018-04-12 MED ORDER — BUPIVACAINE HCL (PF) 0.25 % IJ SOLN
INTRAMUSCULAR | Status: AC
  Filled 2018-04-12: qty 30

## 2018-04-12 MED ORDER — PROMETHAZINE HCL 25 MG/ML IJ SOLN
INTRAMUSCULAR | Status: AC
  Filled 2018-04-12: qty 1

## 2018-04-12 MED ORDER — ACETAMINOPHEN 10 MG/ML IV SOLN
INTRAVENOUS | Status: DC | PRN
  Administered 2018-04-12: 1000 mg via INTRAVENOUS

## 2018-04-12 MED ORDER — SCOPOLAMINE 1 MG/3DAYS TD PT72
MEDICATED_PATCH | TRANSDERMAL | Status: AC
  Filled 2018-04-12: qty 1

## 2018-04-12 MED ORDER — OXYCODONE HCL 5 MG PO TABS
5.0000 mg | ORAL_TABLET | Freq: Once | ORAL | Status: AC | PRN
  Administered 2018-04-12: 5 mg via ORAL

## 2018-04-12 MED ORDER — ARTIFICIAL TEARS OPHTHALMIC OINT
TOPICAL_OINTMENT | OPHTHALMIC | Status: AC
  Filled 2018-04-12: qty 10.5

## 2018-04-12 MED ORDER — PROMETHAZINE HCL 25 MG/ML IJ SOLN
6.2500 mg | INTRAMUSCULAR | Status: DC | PRN
  Administered 2018-04-12: 6.25 mg via INTRAVENOUS

## 2018-04-12 MED ORDER — ACETAMINOPHEN 10 MG/ML IV SOLN
INTRAVENOUS | Status: AC
  Filled 2018-04-12: qty 100

## 2018-04-12 MED ORDER — OXYCODONE HCL 5 MG PO TABS
ORAL_TABLET | ORAL | Status: AC
  Filled 2018-04-12: qty 1

## 2018-04-12 SURGICAL SUPPLY — 51 items
BARRIER ADHS 3X4 INTERCEED (GAUZE/BANDAGES/DRESSINGS) ×3 IMPLANT
CANISTER SUCT 3000ML PPV (MISCELLANEOUS) ×3 IMPLANT
CATH FOLEY 3WAY  5CC 16FR (CATHETERS) ×2
CATH FOLEY 3WAY 5CC 16FR (CATHETERS) ×1 IMPLANT
COVER BACK TABLE 60X90IN (DRAPES) ×3 IMPLANT
COVER TIP SHEARS 8 DVNC (MISCELLANEOUS) ×1 IMPLANT
COVER TIP SHEARS 8MM DA VINCI (MISCELLANEOUS) ×2
DECANTER SPIKE VIAL GLASS SM (MISCELLANEOUS) ×6 IMPLANT
DEFOGGER SCOPE WARMER CLEARIFY (MISCELLANEOUS) ×3 IMPLANT
DERMABOND ADVANCED (GAUZE/BANDAGES/DRESSINGS) ×2
DERMABOND ADVANCED .7 DNX12 (GAUZE/BANDAGES/DRESSINGS) ×1 IMPLANT
DRAPE ARM DVNC X/XI (DISPOSABLE) ×4 IMPLANT
DRAPE COLUMN DVNC XI (DISPOSABLE) ×1 IMPLANT
DRAPE DA VINCI XI ARM (DISPOSABLE) ×8
DRAPE DA VINCI XI COLUMN (DISPOSABLE) ×2
DURAPREP 26ML APPLICATOR (WOUND CARE) ×3 IMPLANT
ELECT REM PT RETURN 15FT ADLT (MISCELLANEOUS) ×3 IMPLANT
GLOVE BIO SURGEON STRL SZ7.5 (GLOVE) ×9 IMPLANT
GLOVE BIOGEL PI IND STRL 7.0 (GLOVE) ×2 IMPLANT
GLOVE BIOGEL PI INDICATOR 7.0 (GLOVE) ×4
IRRIG SUCT STRYKERFLOW 2 WTIP (MISCELLANEOUS) ×3
IRRIGATION SUCT STRKRFLW 2 WTP (MISCELLANEOUS) ×1 IMPLANT
NEEDLE INSUFFLATION 150MM (ENDOMECHANICALS) ×3 IMPLANT
OBTURATOR OPTICAL STANDARD 8MM (TROCAR) ×2
OBTURATOR OPTICAL STND 8 DVNC (TROCAR) ×1
OBTURATOR OPTICALSTD 8 DVNC (TROCAR) ×1 IMPLANT
OCCLUDER COLPOPNEUMO (BALLOONS) ×3 IMPLANT
PACK ROBOT WH (CUSTOM PROCEDURE TRAY) ×3 IMPLANT
PACK ROBOTIC GOWN (GOWN DISPOSABLE) ×3 IMPLANT
PACK TRENDGUARD 450 HYBRID PRO (MISCELLANEOUS) ×1 IMPLANT
PAD PREP 24X48 CUFFED NSTRL (MISCELLANEOUS) ×3 IMPLANT
POSITIONER SURGICAL ARM (MISCELLANEOUS) ×6 IMPLANT
SEAL CANN UNIV 5-8 DVNC XI (MISCELLANEOUS) ×3 IMPLANT
SEAL XI 5MM-8MM UNIVERSAL (MISCELLANEOUS) ×6
SET CYSTO W/LG BORE CLAMP LF (SET/KITS/TRAYS/PACK) IMPLANT
SET TRI-LUMEN FLTR TB AIRSEAL (TUBING) ×3 IMPLANT
SUT VIC AB 0 CT1 27 (SUTURE) ×4
SUT VIC AB 0 CT1 27XBRD ANBCTR (SUTURE) ×2 IMPLANT
SUT VICRYL 0 UR6 27IN ABS (SUTURE) ×3 IMPLANT
SUT VICRYL RAPIDE 4/0 PS 2 (SUTURE) ×6 IMPLANT
SUT VLOC 180 0 9IN  GS21 (SUTURE)
SUT VLOC 180 0 9IN GS21 (SUTURE) IMPLANT
TIP RUMI ORANGE 6.7MMX12CM (TIP) IMPLANT
TIP UTERINE 5.1X6CM LAV DISP (MISCELLANEOUS) IMPLANT
TIP UTERINE 6.7X10CM GRN DISP (MISCELLANEOUS) IMPLANT
TIP UTERINE 6.7X6CM WHT DISP (MISCELLANEOUS) IMPLANT
TIP UTERINE 6.7X8CM BLUE DISP (MISCELLANEOUS) ×3 IMPLANT
TOWEL OR 17X26 10 PK STRL BLUE (TOWEL DISPOSABLE) ×6 IMPLANT
TRENDGUARD 450 HYBRID PRO PACK (MISCELLANEOUS) ×3
TROCAR PORT AIRSEAL 8X120 (TROCAR) ×3 IMPLANT
WATER STERILE IRR 1000ML POUR (IV SOLUTION) ×3 IMPLANT

## 2018-04-12 NOTE — H&P (Signed)
NAME: Sherry Carr, Sherry Carr MEDICAL RECORD [id number] ACCOUNT [id number] DATE OF BIRTH:[birth year] FACILITY: [facility] LOCATION: [facility] PHYSICIAN:Wrigley Winborne J. Billy Coast, MD  HISTORY AND PHYSICAL  DATE OF ADMISSION:  04/12/2018  CHIEF COMPLAINT:  Recurrent pelvic pain, dysmenorrhea, dyspareunia with a history of pelvic endometriosis.  HISTORY OF PRESENT ILLNESS:  She is a 32 year old white female G3 P2 with a history of pelvic endometriosis status post laparoscopic resection and fulguration of endometriosis in 2011 and 2015, who presents now with recurrent pain.  MEDICATIONS:  Include Nexplanon, Benadryl as needed, ibuprofen as needed.  SOCIAL HISTORY:  She is a nonsmoker, nondrinker.  She denies domestic or physical violence.  PAST MEDICAL HISTORY:  She has a history of miscarriage x1.  Vaginal delivery x2.  PAST SURGICAL HISTORY:  History of laparoscopy x2, tonsillectomy x1.  FAMILY HISTORY:  Noncontributory.  SOCIAL HISTORY:  Noncontributory.  PHYSICAL EXAMINATION: GENERAL:  A well-developed, well-nourished white female in no acute distress. HEENT:  Normal. NECK:  Supple, full range of motion. LUNGS:  Clear. HEART:  Regular rhythm. ABDOMEN:  Soft, nontender. PELVIC:  Reveals normal-size uterus, slightly tender to palpation, retroverted.  No adnexal masses appreciated. EXTREMITIES:  There are no cords. NEUROLOGIC:  Nonfocal. SKIN:  Intact.  IMPRESSION:  Dysmenorrhea, pelvic pain, dyspareunia, questionable recurrent pelvic endometriosis.  PLAN:  Proceed with da Vinci-assisted laparoscopic excision, fulguration, possible ablation of endometriosis.  Risks of anesthesia, infection, bleeding, injury to surrounding organs, possible need for repair was discussed.  Delayed versus immediate  complications to include bowel and bladder injury were discussed.  Inability to cure all pelvic pain and possible need for definitive surgery in the future were discussed.  The patient now just wishes to  proceed.  LN/NUANCE  D:04/11/2018 T:04/12/2018 JOB:[id number]

## 2018-04-12 NOTE — Progress Notes (Signed)
Patient became hot, nauseated, and pale when RN attempted IV start left forearm. She became unresponsive yet vital signs stable. Oxygen applied and HOB lowered.

## 2018-04-12 NOTE — Op Note (Signed)
04/12/2018  2:45 PM  PATIENT:  Sherry Carr  32 y.o. female  PRE-OPERATIVE DIAGNOSIS: Secondary dysmenorrhea, dyspareunia, pelvic pain  POST-OPERATIVE DIAGNOSIS:  endometriosis  PROCEDURE:  Procedure(s): XI ROBOTIC ASSISTED LAPAROSCOPIC Excision of posterior cul de sac, left and right ovarian fossa endometriosis Ablation and excision of serosal (uterine) endometriosis Ablation of bilateral ovarian endometriosis Lysis of bilateral peri ovarian adhesions and lysis of sigmoid to left adnexal adhesions Excision of posterior cul de sac Masters window.  SURGEON:  Surgeon(s): Olivia Mackie, MD Shea Evans, MD  ASSISTANTS: Juliene Pina, MD   ANESTHESIA:   local and general  ESTIMATED BLOOD LOSS: minimal   DRAINS: Urinary Catheter (Foley)   LOCAL MEDICATIONS USED:  MARCAINE    and Amount: 30 ml  SPECIMEN:  Source of Specimen:  multiple peritoneal implants  DISPOSITION OF SPECIMEN:  PATHOLOGY  COUNTS:  YES  DICTATION #: [id number]  PLAN OF CARE: dc home  PATIENT DISPOSITION:  PACU - hemodynamically stable.

## 2018-04-12 NOTE — Anesthesia Procedure Notes (Signed)
Procedure Name: Intubation Date/Time: 04/12/2018 1:27 PM Performed by: Wanita Chamberlain, CRNA Pre-anesthesia Checklist: Timeout performed, Patient being monitored, Suction available, Emergency Drugs available and Patient identified Patient Re-evaluated:Patient Re-evaluated prior to induction Oxygen Delivery Method: Circle system utilized Preoxygenation: Pre-oxygenation with 100% oxygen Induction Type: IV induction Ventilation: Mask ventilation without difficulty Laryngoscope Size: Mac and 3 Tube type: Grade I w/ cricoid manipulation. Tube size: 7.0 mm Number of attempts: 1 Airway Equipment and Method: Stylet Placement Confirmation: breath sounds checked- equal and bilateral,  CO2 detector and positive ETCO2 Secured at: 22 cm Tube secured with: Tape Dental Injury: Teeth and Oropharynx as per pre-operative assessment

## 2018-04-12 NOTE — Op Note (Signed)
NAME: Sherry Carr, SARR MEDICAL RECORD [id number] ACCOUNT [id number] DATE OF BIRTH:1986-04-30 FACILITY: [facility] LOCATION: [facility] PHYSICIAN:Modena Bellemare J. Wyman Meschke, MD  OPERATIVE REPORT  DATE OF PROCEDURE:  04/12/2018  PREOPERATIVE DIAGNOSES:  Pelvic pain, dyspareunia, dysmenorrhea, history of pelvic endometriosis.  POSTOPERATIVE DIAGNOSES:  Pelvic pain, dyspareunia, dysmenorrhea, history of pelvic endometriosis plus recurrent pelvic endometriosis.  SURGEON:  Olivia Mackie, MD  ASSISTANT: Mody  ANESTHESIA:  General and local.  ESTIMATED BLOOD LOSS:  Less than 50 mL.  COMPLICATIONS:  None.  COUNTS:  Correct.  DISPOSITION:  The patient to recovery in good condition.  SPECIMENS:  Multiple peritoneal implants of pelvic endometriosis.  DESCRIPTION OF PROCEDURE:  After being apprised of the risks of anesthesia, infection, bleeding, inability to cure all pelvic pain, the patient was brought to the operating room where she was administered a general anesthetic without complications.  She  was prepped and draped in the usual sterile fashion.  Feet were placed in Yellofin stirrups.  Exam under anesthesia revealed a retroflexed to midposition uterus.  RUMI retractor placed vaginally without difficulty.  Foley catheter placed.  Attention was  turned to the abdominal portion of the procedure whereby an infraumbilical incision was made with a scalpel.  A Veress needle was placed.  Opening pressure -1.  Four liters of CO2 insufflated without difficulty.  Trocar placed atraumatically.   Visualization revealed multiple vesicular implants along the uterine fundus and posterior serosal aspect, now extending down into the cul-de-sac.  There was an Master's window in the cul-de-sac.  In addition, there is evidence of adhesions of both  ovaries and the bilateral ovarian fossa and sigmoid adhesions to the left adnexa. Three additional trocars were placed, the AirSeal site on the left, robotic site on the  left and the right.  The robot was docked in the standard fashion after achieving  deep Trendelenburg position.  Ureters were seen bilaterally.  Sharp excision of all posterior cul-de-sac implants were done.  Both ovaries were freed sharply and bluntly.  Ablation of posterior serosal endometriosis.  Excision of posterior serosal  uterine endometriosis was done.  Master's window in the right perirectal space was everted and the peritoneum was excised.  Good hemostasis was noted.  Ablation of endometriosis and bilateral ovarian fossa was performed.  Lysis of sigmoid adhesions to  the left adnexa was performed.  At the end of the procedure, all evidence of endometriosis had been removed or ablated.  Good hemostasis was noted.  Ureters were seen peristalsing normally bilaterally.  Urine was clear.  All instruments were removed.   CO2 released.  Incisions were closed with 4-0 Vicryl in multiple ports.  Marcaine was placed.  Dermabond was placed.  The Rumi retractor removed vaginally.  Please note during the procedure the ropivacaine solution was placed, and Interceed was placed in  the posterior uterine wall for prevention of further adhesions.  The patient tolerated the procedure well, was awakened, and transferred to recovery in good condition.  LN/NUANCE  D:04/12/2018 T:04/12/2018 JOB:[id number]

## 2018-04-12 NOTE — Anesthesia Postprocedure Evaluation (Signed)
Anesthesia Post Note  Patient: Sherry Carr  Procedure(s) Performed: XI ROBOTIC ASSISTED LAPAROSCOPIC Excision/Ablation of Endometriosis (N/A Abdomen)     Patient location during evaluation: PACU Anesthesia Type: General Level of consciousness: sedated Pain management: pain level controlled Vital Signs Assessment: post-procedure vital signs reviewed and stable Respiratory status: spontaneous breathing and respiratory function stable Cardiovascular status: stable Postop Assessment: no apparent nausea or vomiting Anesthetic complications: no    Last Vitals:  Vitals:   04/12/18 1521 04/12/18 1530  BP: 123/75 125/72  Pulse:  83  Resp:  (!) 29  Temp:  36.8 C  SpO2:  99%    Last Pain:  Vitals:   04/12/18 1545  TempSrc:   PainSc: 6                  [name] DANIEL

## 2018-04-12 NOTE — Transfer of Care (Signed)
Immediate Anesthesia Transfer of Care Note  Patient: Sherry Carr  Procedure(s) Performed: XI ROBOTIC ASSISTED LAPAROSCOPIC Excision/Ablation of Endometriosis (N/A Abdomen)  Patient Location: PACU  Anesthesia Type:General  Level of Consciousness: awake, alert , oriented and patient cooperative  Airway & Oxygen Therapy: Patient Spontanous Breathing and Patient connected to face mask oxygen  Post-op Assessment: Report given to RN and Post -op Vital signs reviewed and stable  Post vital signs: Reviewed and stable  Last Vitals:  Vitals Value Taken Time  BP    Temp    Pulse    Resp    SpO2      Last Pain:  Vitals:   04/12/18 1113  TempSrc: Oral         Complications: No apparent anesthesia complications

## 2018-04-12 NOTE — Anesthesia Preprocedure Evaluation (Signed)
Anesthesia Evaluation  Patient identified by MRN, date of birth, ID band Patient awake    Reviewed: Allergy & Precautions, H&P , NPO status , Patient's Chart, lab work & pertinent test results  Airway Mallampati: I  TM Distance: >3 FB Neck ROM: full    Dental no notable dental hx.    Pulmonary neg pulmonary ROS,    Pulmonary exam normal breath sounds clear to auscultation       Cardiovascular Normal cardiovascular exam+ dysrhythmias  Rhythm:Regular     Neuro/Psych negative neurological ROS  negative psych ROS   GI/Hepatic negative GI ROS, Neg liver ROS,   Endo/Other  negative endocrine ROS  Renal/GU negative Renal ROS     Musculoskeletal   Abdominal Normal abdominal exam  (+)   Peds  Hematology negative hematology ROS (+) anemia ,   Anesthesia Other Findings   Reproductive/Obstetrics (+) Pregnancy                             Anesthesia Physical  Anesthesia Plan  ASA: II  Anesthesia Plan: General   Post-op Pain Management:    Induction: Intravenous  PONV Risk Score and Plan: 4 or greater and Ondansetron, Dexamethasone, Midazolam and Scopolamine patch - Pre-op  Airway Management Planned: Oral ETT  Additional Equipment:   Intra-op Plan:   Post-operative Plan: Extubation in OR  Informed Consent: I have reviewed the patients History and Physical, chart, labs and discussed the procedure including the risks, benefits and alternatives for the proposed anesthesia with the patient or authorized representative who has indicated his/her understanding and acceptance.   Dental advisory given  Plan Discussed with: CRNA  Anesthesia Plan Comments:         Anesthesia Quick Evaluation

## 2018-04-12 NOTE — Progress Notes (Signed)
Patient seen and examined. Consent witnessed and signed. No changes noted. Update completed. 

## 2018-04-13 ENCOUNTER — Encounter (HOSPITAL_COMMUNITY): Payer: Self-pay | Admitting: Obstetrics and Gynecology

## 2019-02-21 IMAGING — DX DG CHEST 2V
2 series · 2 of 2 positions shown · non-contrast
Comparison: None.

CLINICAL DATA: Chest pain and pressure for 2 months

EXAM:
CHEST  2 VIEW

[chest pa]
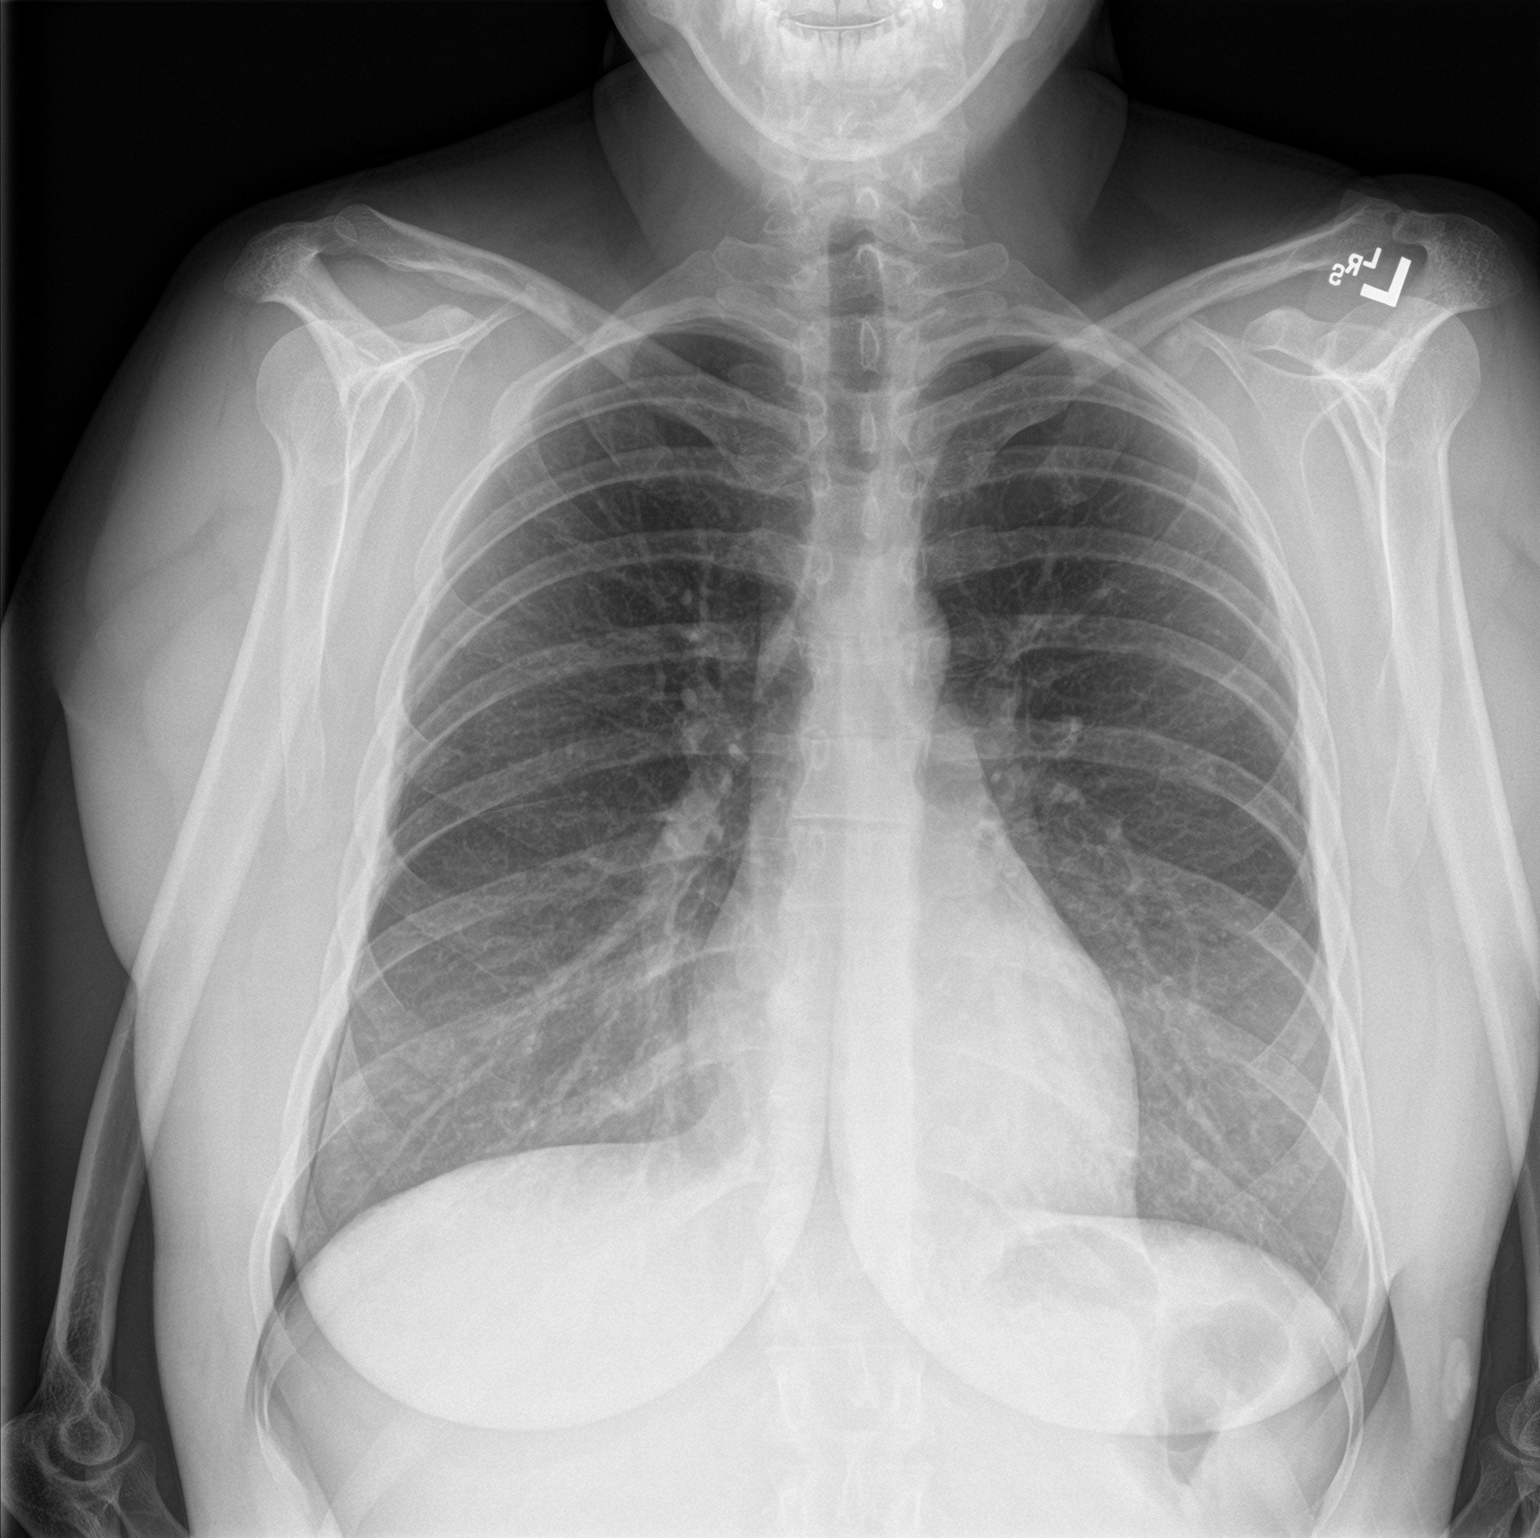

[chest lat]
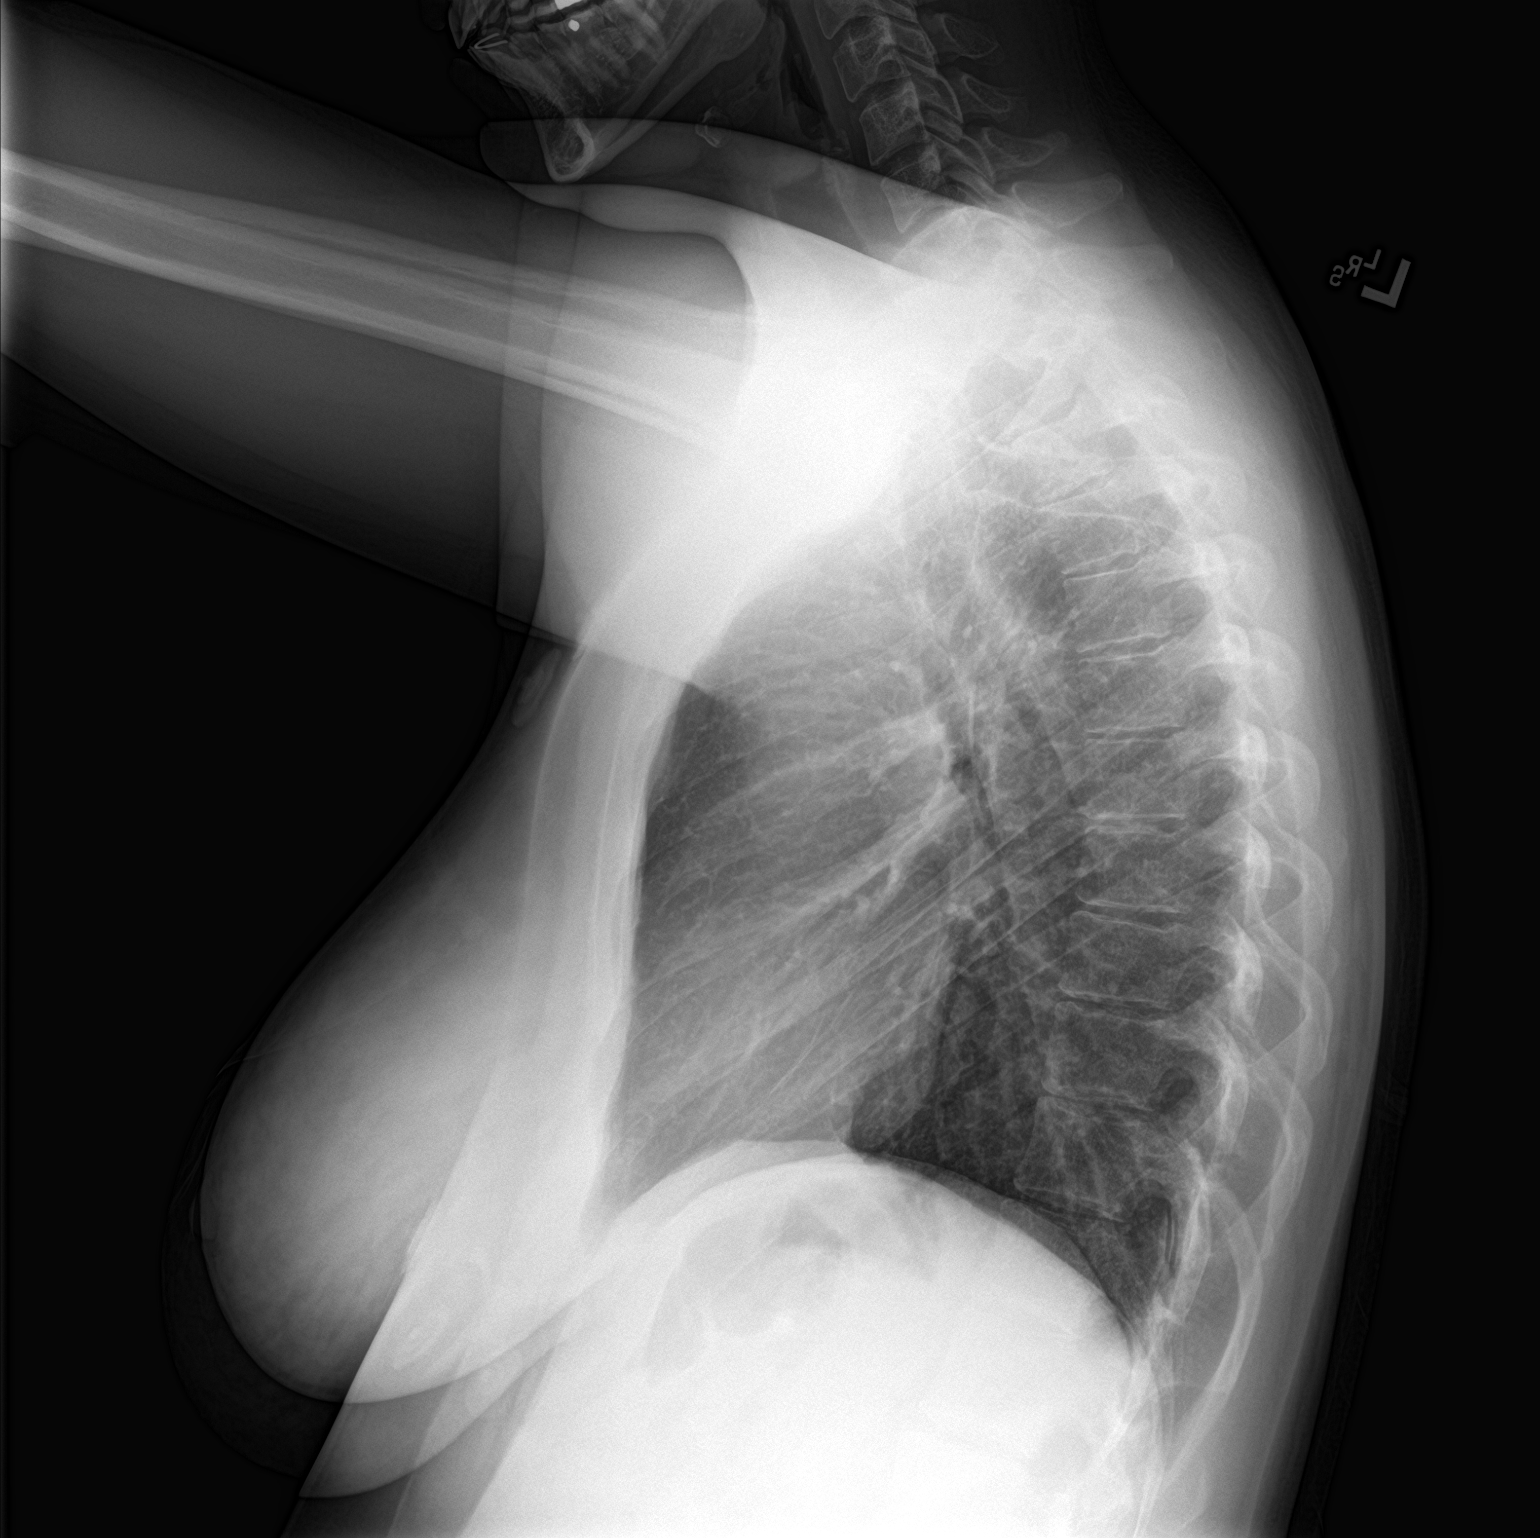

[2 of 2 positions shown; findings below may reference images not displayed]

FINDINGS: The heart size and mediastinal contours are within normal limits.
Both lungs are clear. The visualized skeletal structures are
unremarkable.
IMPRESSION: No active cardiopulmonary disease.
